# Patient Record
Sex: Male | Born: 1935 | Race: White | Hispanic: No | Marital: Married | Smoking: Former smoker
Health system: Southern US, Community
[De-identification: ages and names within clinical notes are randomized; demographics above are authoritative.]

## PROBLEM LIST (undated history)

## (undated) DIAGNOSIS — M109 Gout, unspecified: Secondary | ICD-10-CM

## (undated) DIAGNOSIS — C4490 Unspecified malignant neoplasm of skin, unspecified: Secondary | ICD-10-CM

## (undated) DIAGNOSIS — I509 Heart failure, unspecified: Secondary | ICD-10-CM

## (undated) DIAGNOSIS — I10 Essential (primary) hypertension: Secondary | ICD-10-CM

## (undated) DIAGNOSIS — I4891 Unspecified atrial fibrillation: Secondary | ICD-10-CM

## (undated) DIAGNOSIS — E860 Dehydration: Secondary | ICD-10-CM

## (undated) DIAGNOSIS — E7800 Pure hypercholesterolemia, unspecified: Secondary | ICD-10-CM

## (undated) HISTORY — DX: Dehydration: E86.0

## (undated) HISTORY — DX: Essential (primary) hypertension: I10

## (undated) HISTORY — DX: Gout, unspecified: M10.9

## (undated) HISTORY — DX: Heart failure, unspecified: I50.9

## (undated) HISTORY — PX: EYE SURGERY: SHX253

---

## 2011-06-29 HISTORY — PX: BASAL CELL CARCINOMA EXCISION: SHX1214

## 2011-11-30 ENCOUNTER — Encounter (HOSPITAL_BASED_OUTPATIENT_CLINIC_OR_DEPARTMENT_OTHER): Payer: Self-pay | Admitting: *Deleted

## 2011-11-30 ENCOUNTER — Ambulatory Visit (INDEPENDENT_AMBULATORY_CARE_PROVIDER_SITE_OTHER): Payer: Medicare Other | Admitting: Internal Medicine

## 2011-11-30 ENCOUNTER — Emergency Department (HOSPITAL_BASED_OUTPATIENT_CLINIC_OR_DEPARTMENT_OTHER)
Admission: EM | Admit: 2011-11-30 | Discharge: 2011-11-30 | Disposition: A | Discharge status: Other Acute Inpatient Hospital | Payer: Medicare Other | Attending: Emergency Medicine | Admitting: Emergency Medicine

## 2011-11-30 ENCOUNTER — Encounter: Payer: Self-pay | Admitting: Internal Medicine

## 2011-11-30 ENCOUNTER — Emergency Department (HOSPITAL_BASED_OUTPATIENT_CLINIC_OR_DEPARTMENT_OTHER): Payer: Medicare Other

## 2011-11-30 VITALS — HR 114 | Ht 66.75 in | Wt 179.0 lb

## 2011-11-30 DIAGNOSIS — E7800 Pure hypercholesterolemia, unspecified: Secondary | ICD-10-CM | POA: Insufficient documentation

## 2011-11-30 DIAGNOSIS — Z7901 Long term (current) use of anticoagulants: Secondary | ICD-10-CM | POA: Insufficient documentation

## 2011-11-30 DIAGNOSIS — I10 Essential (primary) hypertension: Secondary | ICD-10-CM | POA: Insufficient documentation

## 2011-11-30 DIAGNOSIS — I509 Heart failure, unspecified: Secondary | ICD-10-CM | POA: Insufficient documentation

## 2011-11-30 DIAGNOSIS — R0902 Hypoxemia: Secondary | ICD-10-CM

## 2011-11-30 DIAGNOSIS — I4891 Unspecified atrial fibrillation: Secondary | ICD-10-CM | POA: Insufficient documentation

## 2011-11-30 DIAGNOSIS — J9611 Chronic respiratory failure with hypoxia: Secondary | ICD-10-CM | POA: Insufficient documentation

## 2011-11-30 HISTORY — DX: Pure hypercholesterolemia, unspecified: E78.00

## 2011-11-30 HISTORY — DX: Unspecified malignant neoplasm of skin, unspecified: C44.90

## 2011-11-30 HISTORY — DX: Essential (primary) hypertension: I10

## 2011-11-30 HISTORY — DX: Unspecified atrial fibrillation: I48.91

## 2011-11-30 LAB — COMPREHENSIVE METABOLIC PANEL
ALT: 36 U/L (ref 0–53)
Alkaline Phosphatase: 61 U/L (ref 39–117)
BUN: 30 mg/dL — ABNORMAL HIGH (ref 6–23)
CO2: 26 mEq/L (ref 19–32)
Calcium: 9.4 mg/dL (ref 8.4–10.5)
GFR calc Af Amer: 64 mL/min — ABNORMAL LOW (ref >90)
GFR calc non Af Amer: 55 mL/min — ABNORMAL LOW (ref >90)
Glucose, Bld: 112 mg/dL — ABNORMAL HIGH (ref 70–99)
Sodium: 137 mEq/L (ref 135–145)

## 2011-11-30 LAB — DIFFERENTIAL
Basophils Absolute: 0.1 10*3/uL (ref 0.0–0.1)
Basophils Relative: 1 % (ref 0–1)
Neutro Abs: 8.7 10*3/uL — ABNORMAL HIGH (ref 1.7–7.7)
Neutrophils Relative %: 81 % — ABNORMAL HIGH (ref 43–77)

## 2011-11-30 LAB — CBC
Hemoglobin: 16.6 g/dL (ref 13.0–17.0)
MCHC: 33.3 g/dL (ref 30.0–36.0)
RDW: 17.3 % — ABNORMAL HIGH (ref 11.5–15.5)
WBC: 10.8 10*3/uL — ABNORMAL HIGH (ref 4.0–10.5)

## 2011-11-30 MED ORDER — FUROSEMIDE 10 MG/ML IJ SOLN
60.0000 mg | Freq: Once | INTRAMUSCULAR | Status: AC
  Administered 2011-11-30: 60 mg via INTRAVENOUS
  Filled 2011-11-30: qty 6

## 2011-11-30 MED ORDER — NITROGLYCERIN 2 % TD OINT
1.0000 [in_us] | TOPICAL_OINTMENT | Freq: Four times a day (QID) | TRANSDERMAL | Status: DC
  Administered 2011-11-30: 1 [in_us] via TOPICAL
  Filled 2011-11-30: qty 1

## 2011-11-30 NOTE — Progress Notes (Signed)
  Subjective:    Patient ID: Jordan Bautista, male    DOB: August 18, 1928, 78 y.o.   MRN: [id number]  HPI Pt presents to clinic for evaluation of dyspnea and congestion. Notes several week h/o dyspnea without wheezing. Home o2 sat reportedly 69%. No h/o chronic lung disease. Physicians are in [place] and he reports undergoing recent ?PFT with unknown results. Was told he was mildly polycythemic. Unaware of any h/o hypoxia. o2 sat in clinic initially 60's on RA. Increased to upper 80's-90% with 6l [place].   Past Medical History  Diagnosis Date  . Atrial fibrillation   . High cholesterol   . Hypertension   . Skin cancer    No past surgical history on file.  reports that he has quit smoking. He does not have any smokeless tobacco history on file. He reports that he drinks alcohol. He reports that he does not use illicit drugs. family history is not on file. No Known Allergies   Review of Systems  Respiratory: Positive for shortness of breath. Negative for wheezing.   Cardiovascular: Negative for chest pain.  All other systems reviewed and are negative.       Objective:   Physical Exam  Nursing note and vitals reviewed. Constitutional: He appears well-developed and well-nourished. No distress.  HENT:  Head: Normocephalic and atraumatic.  Pulmonary/Chest: Effort normal and breath sounds normal. No respiratory distress. He has no wheezes. He has no rales.  Neurological: He is alert.  Skin: Skin is warm and dry. He is not diaphoretic.  Psychiatric: He has a normal mood and affect.          Assessment & Plan:

## 2011-11-30 NOTE — ED Notes (Signed)
Patient states that over the past week he developed SOB that grew worse, went to primary today and was sent to ER,  No Hx of respiratory problems

## 2011-11-30 NOTE — ED Provider Notes (Signed)
History     CSN: [id number]  Arrival date & time 11/30/11  1138   First MD Initiated Contact with Patient 11/30/11 1211      Chief Complaint  Patient presents with  . Shortness of Breath    HPI Patient presents over the past few days with increasing shortness of breath especially with exertion.  Went to primary care doctor today who found him to be hypoxic on room air and sent her to the emergency department.  Patient has a history of chronic atrial for ablation and takes Coumadin for that.  He also is on dig and amlodipine and metoprolol for blood pressure control.  Patient's never had a heart attack.  He denies any specific chest pain. Past Medical History  Diagnosis Date  . Atrial fibrillation   . High cholesterol   . Hypertension   . Skin cancer     History reviewed. No pertinent past surgical history.  No family history on file.  History  Substance Use Topics  . Smoking status: Former Games developer  . Smokeless tobacco: Not on file  . Alcohol Use: Yes      Review of Systems  Allergies  Review of patient's allergies indicates no known allergies.  Home Medications   Current Outpatient Rx  Name Route Sig Dispense Refill  . AMLODIPINE BESYLATE 5 MG PO TABS Oral Take 5 mg by mouth daily.    Marland Kitchen DIGOXIN 0.25 MG PO TABS Oral Take 250 mcg by mouth daily.    . OMEGA-3 FATTY ACIDS 1000 MG PO CAPS Oral Take 1 g by mouth daily.    Marland Kitchen METOPROLOL SUCCINATE ER 100 MG PO TB24 Oral Take 100 mg by mouth daily. Take with or immediately following a meal.    . NIACIN ER (ANTIHYPERLIPIDEMIC) 1000 MG PO TBCR Oral Take 1,000 mg by mouth at bedtime.    Marland Kitchen PRAVASTATIN SODIUM 20 MG PO TABS Oral Take 20 mg by mouth daily.    . WARFARIN SODIUM 10 MG PO TABS Oral Take 7.5 mg by mouth daily. 10 mg on Sunday      BP 135/81  Pulse 90  Temp(Src) 97.9 F (36.6 C) (Oral)  Resp 20  Ht 5' 7.5" (1.715 m)  Wt 177 lb (80.287 kg)  BMI 27.31 kg/m2  SpO2 90%  Physical Exam  Nursing note and vitals  reviewed. Constitutional: He is oriented to person, place, and time. He appears well-developed and well-nourished. No distress.  HENT:  Head: Normocephalic and atraumatic.  Eyes: Pupils are equal, round, and reactive to light.  Neck: Normal range of motion.  Cardiovascular: Normal rate and intact distal pulses.  An irregularly irregular rhythm present.       HR fibrillation Rate = 92 QTC = 400 QRS = 90 axis = normal Abnormal EKG No previous EKG for comparison  Pulmonary/Chest: He has rales.  Abdominal: Soft. Normal appearance and bowel sounds are normal. He exhibits no distension.  Musculoskeletal: Normal range of motion.  Neurological: He is alert and oriented to person, place, and time. No cranial nerve deficit.  Skin: Skin is warm and dry. No rash noted.  Psychiatric: He has a normal mood and affect. His behavior is normal.    ED Course  Procedures (including critical care time) Scheduled Meds:    . furosemide  60 mg Intravenous Once  . nitroGLYCERIN  1 inch Topical Q6H   Continuous Infusions:  PRN Meds:.  Labs Reviewed  PRO B NATRIURETIC PEPTIDE - Abnormal; Notable for the following:  Pro B Natriuretic peptide (BNP) 6726.0 (*)    All other components within normal limits  PROTIME-INR - Abnormal; Notable for the following:    Prothrombin Time 31.2 (*)    INR 2.95 (*)    All other components within normal limits  COMPREHENSIVE METABOLIC PANEL - Abnormal; Notable for the following:    Glucose, Bld 112 (*)    BUN 30 (*)    Albumin 3.4 (*)    Total Bilirubin 1.5 (*)    GFR calc non Af Amer 55 (*)    GFR calc Af Amer 64 (*)    All other components within normal limits  CBC - Abnormal; Notable for the following:    WBC 10.8 (*)    RDW 17.3 (*)    All other components within normal limits  DIFFERENTIAL - Abnormal; Notable for the following:    Neutrophils Relative 81 (*)    Neutro Abs 8.7 (*)    Lymphocytes Relative 7 (*)    Monocytes Absolute 1.2 (*)    All  other components within normal limits  TROPONIN I   Dg Chest 2 View  11/30/2011  *RADIOLOGY REPORT*  Clinical Data: Shortness of breath for 1 week, history atrial fibrillation, hypertension, hypercholesterolemia, skin cancer  CHEST - 2 VIEW  Comparison: None  Findings: Enlargement of cardiac silhouette. Tortuous aorta with atherosclerotic calcification. Mediastinal contours normal. Patchy bilateral pulmonary infiltrates could represent infection or edema. A more focal area of opacity is seen at the right base, approximately 2.7 x 2.0 cm in size, potentially representing a focal of dense consolidation though underlying nodule is not excluded. No gross pleural effusion or pneumothorax. No acute osseous findings.  IMPRESSION: Enlargement of cardiac silhouette. Patchy bilateral pulmonary infiltrates question edema versus infection. More focal opacity right lung base 2.7 x 2.0 cm in size, question more focal consolidation but underlying mass/nodule not excluded; recommend follow-up radiographs until resolution to exclude underlying pulmonary nodule.  Original Report Authenticated By: Lollie Marrow, M.D.     1. Congestive heart failure       MDM   Patient is requesting admission to [facility].  Plan is to contact the hospitalist for discussion about transfer.      Nelia Shi, MD 11/30/11 [phone]

## 2011-11-30 NOTE — Assessment & Plan Note (Signed)
Given new dx hypoxia recommend transfer to ED for further evaluation. Pt and wife agreeable. Transferred with portable o2 to [facility] ED.

## 2011-12-10 ENCOUNTER — Telehealth: Payer: Self-pay | Admitting: Internal Medicine

## 2011-12-10 NOTE — Telephone Encounter (Signed)
Received medical records from [facility] ° °P: [phone] °F: 878-6100 °

## 2011-12-14 ENCOUNTER — Ambulatory Visit: Payer: BC Managed Care – HMO | Admitting: Internal Medicine

## 2011-12-14 ENCOUNTER — Encounter: Payer: Self-pay | Admitting: Internal Medicine

## 2011-12-14 ENCOUNTER — Ambulatory Visit (INDEPENDENT_AMBULATORY_CARE_PROVIDER_SITE_OTHER): Payer: Medicare Other | Admitting: Internal Medicine

## 2011-12-14 VITALS — BP 108/80 | HR 83 | Temp 97.9°F | Resp 20 | Ht 66.75 in | Wt 162.0 lb

## 2011-12-14 DIAGNOSIS — I509 Heart failure, unspecified: Secondary | ICD-10-CM

## 2011-12-14 DIAGNOSIS — Z7901 Long term (current) use of anticoagulants: Secondary | ICD-10-CM

## 2011-12-14 DIAGNOSIS — I272 Pulmonary hypertension, unspecified: Secondary | ICD-10-CM

## 2011-12-14 DIAGNOSIS — I4891 Unspecified atrial fibrillation: Secondary | ICD-10-CM

## 2011-12-14 DIAGNOSIS — I5032 Chronic diastolic (congestive) heart failure: Secondary | ICD-10-CM | POA: Insufficient documentation

## 2011-12-14 DIAGNOSIS — E785 Hyperlipidemia, unspecified: Secondary | ICD-10-CM

## 2011-12-14 DIAGNOSIS — R634 Abnormal weight loss: Secondary | ICD-10-CM

## 2011-12-14 DIAGNOSIS — I2789 Other specified pulmonary heart diseases: Secondary | ICD-10-CM

## 2011-12-14 NOTE — Assessment & Plan Note (Signed)
Obtain lipid/lft prior to next visit 

## 2011-12-14 NOTE — Patient Instructions (Signed)
Please schedule early morning fasting labs prior to next visit Lipid/lft 272.4, tsh-weight loss, testosterone (decreased muscle mass)

## 2011-12-14 NOTE — Assessment & Plan Note (Signed)
Pulmonary consult for secondary workup

## 2011-12-14 NOTE — Assessment & Plan Note (Signed)
Predates diuresis. Obtain tsh prior to next visit. Early am testosterone due to muscle loss

## 2011-12-14 NOTE — Progress Notes (Signed)
  Subjective:    Patient ID: Jordan Bautista, male    DOB: [birth year], 78 y.o.   MRN: [id number]  HPI Pt presents to clinic for hospital follow up of hypoxia. Last visit transferred to ED due to hypoxia and dyspnea and was subsequently transferred to [facility] for admission. Tx'ed for heart failure with proBNP>6000. Had neg cardiac enzymes and was diuresed with significant improvement of dyspnea. Was able to play golf recently after dc. Echo demonstrated nl lv size and fxn with EF 65-70% however with evidence of right side overload. +pulmonary HTN with 75mg  Hg. H/o chronic atrial fibrillation but no known h/o chronic lung disease. Prior to moving from [place] was beginning pulmonary workup due to mild polycythemia and underwent ?PFT with unknown results. Does snore according to his wife but unsure regarding apnea. Has plans to follow up with Cardiologist from hospitalization who is now following his coumadin.   Past Medical History  Diagnosis Date  . Atrial fibrillation   . High cholesterol   . Hypertension   . Skin cancer    Past Surgical History  Procedure Date  . Basal cell carcinoma excision 2013    reports that he has quit smoking. He has never used smokeless tobacco. He reports that he drinks alcohol. He reports that he does not use illicit drugs. family history includes Breast cancer in his mother and Diabetes in his son.  There is no history of Prostate cancer, and Colon cancer, and Hypertension, and Heart disease, . No Known Allergies   Review of Systems  Constitutional: Positive for unexpected weight change.       Weight loss predating diuresis.  Loss of muscle mass.  Respiratory: Negative for shortness of breath.        Objective:   Physical Exam  Nursing note and vitals reviewed. Constitutional: He appears well-developed and well-nourished. No distress.  HENT:  Head: Normocephalic and atraumatic.  Right Ear: External ear normal.  Left Ear: External ear normal.    Eyes: Conjunctivae are normal. No scleral icterus.  Neck: Neck supple. No JVD present.  Cardiovascular: Normal rate.  An irregularly irregular rhythm present.  Pulmonary/Chest: Effort normal and breath sounds normal. No respiratory distress. He has no wheezes. He has no rales.  Neurological: He is alert.  Skin: Skin is warm. He is not diaphoretic.  Psychiatric: He has a normal mood and affect.          Assessment & Plan:

## 2011-12-14 NOTE — Assessment & Plan Note (Signed)
Clinically euvolemic. Nl EF.

## 2011-12-28 ENCOUNTER — Other Ambulatory Visit: Payer: Self-pay | Admitting: *Deleted

## 2011-12-28 MED ORDER — ISOSORBIDE DINITRATE 30 MG PO TABS: 30.0000 mg | ORAL_TABLET | Freq: Four times a day (QID) | ORAL | Status: DC

## 2011-12-28 MED ORDER — METOPROLOL SUCCINATE ER 100 MG PO TB24: 100.0000 mg | ORAL_TABLET | Freq: Every day | ORAL | Status: DC

## 2011-12-28 MED ORDER — FUROSEMIDE 40 MG PO TABS: 40.0000 mg | ORAL_TABLET | Freq: Every day | ORAL | Status: DC

## 2011-12-28 MED ORDER — POTASSIUM CHLORIDE CRYS ER 20 MEQ PO TBCR: 20.0000 meq | EXTENDED_RELEASE_TABLET | Freq: Every day | ORAL | Status: DC

## 2011-12-28 MED ORDER — WARFARIN SODIUM 5 MG PO TABS: 5.0000 mg | ORAL_TABLET | Freq: Every day | ORAL | Status: DC

## 2011-12-28 MED ORDER — WARFARIN SODIUM 10 MG PO TABS: ORAL_TABLET | ORAL | Status: DC

## 2011-12-28 NOTE — Telephone Encounter (Signed)
Patient request for Metoprolol, Isosorbide, Potassium Chloride, Furosemide, and Warfarin [10 mg on med list, requested 5 mg-both done] Rx[s] Done to pharmacy/[facility]

## 2012-01-18 ENCOUNTER — Ambulatory Visit: Payer: BC Managed Care – HMO | Admitting: Internal Medicine

## 2012-01-25 ENCOUNTER — Telehealth: Payer: Self-pay | Admitting: *Deleted

## 2012-01-25 DIAGNOSIS — M6289 Other specified disorders of muscle: Secondary | ICD-10-CM

## 2012-01-25 DIAGNOSIS — R634 Abnormal weight loss: Secondary | ICD-10-CM

## 2012-01-25 DIAGNOSIS — E785 Hyperlipidemia, unspecified: Secondary | ICD-10-CM

## 2012-01-25 LAB — LIPID PANEL
Cholesterol: 211 mg/dL — ABNORMAL HIGH (ref 0–200)
Triglycerides: 119 mg/dL (ref <150)

## 2012-01-25 LAB — HEPATIC FUNCTION PANEL
ALT: 14 U/L (ref 0–53)
AST: 18 U/L (ref 0–37)
Alkaline Phosphatase: 47 U/L (ref 39–117)
Indirect Bilirubin: 0.6 mg/dL (ref 0.0–0.9)
Total Protein: 6.3 g/dL (ref 6.0–8.3)

## 2012-01-25 NOTE — Telephone Encounter (Signed)
Pt presented to the lab, future lab orders entered per 12/14/11 office note and given to the lab.

## 2012-01-27 ENCOUNTER — Encounter: Payer: Self-pay | Admitting: Critical Care Medicine

## 2012-01-27 ENCOUNTER — Ambulatory Visit (INDEPENDENT_AMBULATORY_CARE_PROVIDER_SITE_OTHER): Payer: Medicare Other | Admitting: Critical Care Medicine

## 2012-01-27 ENCOUNTER — Other Ambulatory Visit: Payer: Self-pay | Admitting: Critical Care Medicine

## 2012-01-27 VITALS — BP 102/70 | HR 57 | Temp 97.3°F | Ht 67.0 in | Wt 160.0 lb

## 2012-01-27 DIAGNOSIS — I272 Pulmonary hypertension, unspecified: Secondary | ICD-10-CM

## 2012-01-27 DIAGNOSIS — I2789 Other specified pulmonary heart diseases: Secondary | ICD-10-CM

## 2012-01-27 LAB — SEDIMENTATION RATE: Sed Rate: 10 mm/hr (ref 0–16)

## 2012-01-27 LAB — HEPATIC FUNCTION PANEL
Albumin: 4.2 g/dL (ref 3.5–5.2)
Alkaline Phosphatase: 49 U/L (ref 39–117)
Total Bilirubin: 0.9 mg/dL (ref 0.3–1.2)
Total Protein: 7 g/dL (ref 6.0–8.3)

## 2012-01-27 LAB — RHEUMATOID FACTOR: Rhuematoid fact SerPl-aCnc: 16 IU/mL — ABNORMAL HIGH (ref <=14)

## 2012-01-27 NOTE — Assessment & Plan Note (Signed)
Severe pulmonary hypertension with underlying diastolic heart failure and associated atrial fibrillation Cannot rule out primary pulmonary hypertension in the setting Need to rule out obstructive sleep apnea and other forms of chronic lung disease Echo 6 /2013: EF 60%  TR  Severe pulm HTN CTA angio Chest 6/13: No PE.  No pulm fibrosis or emphysema by report No recent PFTs or Sleep study pending   Plan Obtain followup on sleep study Obtain CT scan of chest for direct review Repeat pulmonary function studies with 6 minute walk Obtain pulmonary hypertension serology labs Will likely need right heart catheterization for this we'll be discussing with the patient's cardiologist in [facility] Return in one month with all the above studies to regroup No RHC No serology labs yet performed

## 2012-01-27 NOTE — Progress Notes (Signed)
Subjective:    Patient ID: Jordan Bautista, male    DOB: 1928-07-04, 78 y.o.   MRN: [id number]  Shortness of Breath This is a chronic problem. The current episode started more than 1 year ago. The problem occurs daily (exertional). The problem has been gradually worsening. Associated symptoms include rhinorrhea and sputum production. Pertinent negatives include no abdominal pain, chest pain, claudication, coryza, ear pain, fever, headaches, hemoptysis, leg pain, leg swelling, neck pain, orthopnea, PND, rash, sore throat, swollen glands, syncope, vomiting or wheezing. The symptoms are aggravated by any activity. There is no history of allergies, aspirin allergies, bronchiolitis, CAD, chronic lung disease, DVT, a heart failure, PE or a recent surgery.   This patient is referred for evaluation of pulmonary hypertension. The patient was just hospitalized between the fourth and seventh of June for heart failure. The patient was given Lasix. The patient's had his beta blocker increased. Heart rate control was administered because of atrial fibrillation. CT scan she has showed no pulmonary emboli. A sleep study was to be scheduled but has not yet been completed. The patient is now referred for evaluation of pulmonary hypertension.  Patient denies any cough or chest pain. There is mild postnasal drip. There is dyspnea with exertion but not at rest. Patient denies orthopnea or PND. The patient has no history of COPD.  Pt denies any significant sore throat, nasal congestion or excess secretions, fever, chills, sweats, unintended weight loss, pleurtic or exertional chest pain, orthopnea PND, or leg swelling Pt denies any increase in rescue therapy over baseline, denies waking up needing it or having any early am or nocturnal exacerbations of coughing/wheezing/or dyspnea. Pt also denies any obvious fluctuation in symptoms with  weather or environmental change or other alleviating or aggravating factors   Past  Medical History  Diagnosis Date  . Atrial fibrillation   . High cholesterol   . Hypertension   . Skin cancer   . Heart failure      Family History  Problem Relation Age of Onset  . Prostate cancer Neg Hx   . Colon cancer Neg Hx   . Breast cancer Mother   . Hypertension Neg Hx   . Heart disease Neg Hx   . Diabetes Son     2 sons     History   Social History  . Marital Status: Married    Spouse Name: N/A    Number of Children: N/A  . Years of Education: N/A   Occupational History  . retired     Set designer    Social History Main Topics  . Smoking status: Former Smoker -- 1.0 packs/day for 30 years    Types: Cigarettes, Pipe, Cigars    Quit date: 06/28/1978  . Smokeless tobacco: Never Used  . Alcohol Use: Yes  . Drug Use: No  . Sexually Active: Not on file   Other Topics Concern  . Not on file   Social History Narrative  . No narrative on file     No Known Allergies   Outpatient Prescriptions Prior to Visit  Medication Sig Dispense Refill  . amLODipine (NORVASC) 5 MG tablet Take 5 mg by mouth daily.      Marland Kitchen aspirin 81 MG tablet Take 81 mg by mouth daily.      . digoxin (LANOXIN) 0.25 MG tablet Take 250 mcg by mouth daily.      . fish oil-omega-3 fatty acids 1000 MG capsule Take 1 g by mouth daily.      Marland Kitchen  furosemide (LASIX) 40 MG tablet Take 1 tablet (40 mg total) by mouth daily.  30 tablet  5  . metoprolol succinate (TOPROL-XL) 100 MG 24 hr tablet Take 1 tablet (100 mg total) by mouth daily. Take with or immediately following a meal.  30 tablet  5  . niacin (NIASPAN) 1000 MG CR tablet Take 1,000 mg by mouth at bedtime.      . potassium chloride SA (K-DUR,KLOR-CON) 20 MEQ tablet Take 1 tablet (20 mEq total) by mouth daily.  30 tablet  5  . pravastatin (PRAVACHOL) 20 MG tablet Take 20 mg by mouth daily.      Marland Kitchen warfarin (COUMADIN) 10 MG tablet Take As Directed.  60 tablet  5  . warfarin (COUMADIN) 5 MG tablet Take 1 tablet (5 mg total) by mouth daily.  30  tablet  5  . isosorbide dinitrate (ISORDIL) 30 MG tablet Take 1 tablet (30 mg total) by mouth 4 (four) times daily.  120 tablet  5      Review of Systems  Constitutional: Negative for fever, chills, diaphoresis, activity change, appetite change, fatigue and unexpected weight change.  HENT: Positive for congestion, rhinorrhea, sneezing and tinnitus. Negative for hearing loss, ear pain, nosebleeds, sore throat, facial swelling, mouth sores, trouble swallowing, neck pain, neck stiffness, dental problem, voice change, postnasal drip, sinus pressure and ear discharge.   Eyes: Negative for photophobia, discharge, itching and visual disturbance.  Respiratory: Positive for cough, sputum production and shortness of breath. Negative for apnea, hemoptysis, choking, chest tightness, wheezing and stridor.   Cardiovascular: Negative for chest pain, palpitations, orthopnea, claudication, leg swelling, syncope and PND.  Gastrointestinal: Negative for nausea, vomiting, abdominal pain, constipation, blood in stool and abdominal distention.  Genitourinary: Negative for dysuria, urgency, frequency, hematuria, flank pain, decreased urine volume and difficulty urinating.  Musculoskeletal: Negative for myalgias, back pain, joint swelling, arthralgias and gait problem.  Skin: Negative for color change, pallor and rash.  Neurological: Positive for dizziness and light-headedness. Negative for tremors, seizures, syncope, speech difficulty, weakness, numbness and headaches.  Hematological: Negative for adenopathy. Bruises/bleeds easily.  Psychiatric/Behavioral: Negative for confusion, disturbed wake/sleep cycle and agitation. The patient is not nervous/anxious.        Objective:   Physical Exam Filed Vitals:   01/27/12 1424  BP: 102/70  Pulse: 57  Temp: 97.3 F (36.3 C)  TempSrc: Oral  Height: 5\' 7"  (1.702 m)  Weight: 160 lb (72.576 kg)  SpO2: 93%    Gen: Pleasant, well-nourished, in no distress,  normal  affect  ENT: No lesions,  mouth clear,  oropharynx clear, no postnasal drip  Neck: 2+  JVD, no TMG, no carotid bruits  Lungs: No use of accessory muscles, no dullness to percussion, clear without rales or rhonchi  Cardiovascular: RRR, split P2 otherwise  heart sounds normal, no murmur or gallops, no peripheral edema  Abdomen: soft and NT, no HSM,  BS normal  Musculoskeletal: No deformities, no cyanosis or clubbing  Neuro: alert, non focal  Skin: Warm, no lesions or rashes         Assessment & Plan:   Pulmonary hypertension Severe pulmonary hypertension with underlying diastolic heart failure and associated atrial fibrillation Cannot rule out primary pulmonary hypertension in the setting Need to rule out obstructive sleep apnea and other forms of chronic lung disease Echo 6 /2013: EF 60%  TR  Severe pulm HTN CTA angio Chest 6/13: No PE.  No pulm fibrosis or emphysema by report No recent PFTs or  Sleep study pending   Plan Obtain followup on sleep study Obtain CT scan of chest for direct review Repeat pulmonary function studies with 6 minute walk Obtain pulmonary hypertension serology labs Will likely need right heart catheterization for this we'll be discussing with the patient's cardiologist in [facility] Return in one month with all the above studies to regroup No RHC No serology labs yet performed    Updated Medication List Outpatient Encounter Prescriptions as of 01/27/2012  Medication Sig Dispense Refill  . amLODipine (NORVASC) 5 MG tablet Take 5 mg by mouth daily.      Marland Kitchen aspirin 81 MG tablet Take 81 mg by mouth daily.      . digoxin (LANOXIN) 0.25 MG tablet Take 250 mcg by mouth daily.      . fish oil-omega-3 fatty acids 1000 MG capsule Take 1 g by mouth daily.      . furosemide (LASIX) 40 MG tablet Take 1 tablet (40 mg total) by mouth daily.  30 tablet  5  . isosorbide dinitrate (ISORDIL) 30 MG tablet Take 30 mg by mouth daily.      . metoprolol succinate  (TOPROL-XL) 100 MG 24 hr tablet Take 1 tablet (100 mg total) by mouth daily. Take with or immediately following a meal.  30 tablet  5  . niacin (NIASPAN) 1000 MG CR tablet Take 1,000 mg by mouth at bedtime.      . potassium chloride SA (K-DUR,KLOR-CON) 20 MEQ tablet Take 1 tablet (20 mEq total) by mouth daily.  30 tablet  5  . pravastatin (PRAVACHOL) 20 MG tablet Take 20 mg by mouth daily.      Marland Kitchen warfarin (COUMADIN) 10 MG tablet Take As Directed.  60 tablet  5  . warfarin (COUMADIN) 5 MG tablet Take 1 tablet (5 mg total) by mouth daily.  30 tablet  5  . DISCONTD: isosorbide dinitrate (ISORDIL) 30 MG tablet Take 1 tablet (30 mg total) by mouth 4 (four) times daily.  120 tablet  5

## 2012-01-27 NOTE — Patient Instructions (Addendum)
Get sleep study completed, call us when done Obtain pulmonary function studies and 6 min walk I will obtain CT Chest for review Labs today We will likely ask for a right heart catheterization Return when all of above completed, likely in about one month

## 2012-01-28 ENCOUNTER — Telehealth: Payer: Self-pay | Admitting: Critical Care Medicine

## 2012-01-28 LAB — ANA: Anti Nuclear Antibody(ANA): NEGATIVE

## 2012-01-28 NOTE — Progress Notes (Signed)
Quick Note:  lmomtcb ______ 

## 2012-01-28 NOTE — Telephone Encounter (Signed)
Spoke with pt and notified of results per Dr. Wright. Pt verbalized understanding and denied any questions.  

## 2012-01-28 NOTE — Progress Notes (Signed)
Quick Note:  Call pt and tell him labs are ok, No change in medications ______ 

## 2012-01-28 NOTE — Progress Notes (Signed)
Quick Note:  Spoke with pt and notified of results per Dr. Wright. Pt verbalized understanding and denied any questions.  ______ 

## 2012-01-31 ENCOUNTER — Ambulatory Visit (INDEPENDENT_AMBULATORY_CARE_PROVIDER_SITE_OTHER): Payer: Medicare Other | Admitting: Internal Medicine

## 2012-01-31 ENCOUNTER — Ambulatory Visit (INDEPENDENT_AMBULATORY_CARE_PROVIDER_SITE_OTHER): Payer: Medicare Other | Admitting: Critical Care Medicine

## 2012-01-31 ENCOUNTER — Encounter: Payer: Self-pay | Admitting: Internal Medicine

## 2012-01-31 VITALS — BP 104/66 | HR 61 | Temp 97.6°F | Resp 16 | Ht 66.0 in | Wt 161.0 lb

## 2012-01-31 DIAGNOSIS — I2789 Other specified pulmonary heart diseases: Secondary | ICD-10-CM

## 2012-01-31 DIAGNOSIS — I272 Pulmonary hypertension, unspecified: Secondary | ICD-10-CM

## 2012-01-31 DIAGNOSIS — R42 Dizziness and giddiness: Secondary | ICD-10-CM

## 2012-01-31 DIAGNOSIS — E785 Hyperlipidemia, unspecified: Secondary | ICD-10-CM

## 2012-01-31 LAB — PULMONARY FUNCTION TEST

## 2012-01-31 MED ORDER — PRAVASTATIN SODIUM 40 MG PO TABS: 40.0000 mg | ORAL_TABLET | Freq: Every day | ORAL | Status: DC

## 2012-01-31 NOTE — Progress Notes (Signed)
PFT done today. 

## 2012-01-31 NOTE — Assessment & Plan Note (Signed)
Pt relates to possible medication side effect. Trial of decreased norvasc dose 2.5mg  po qd. Monitor bp

## 2012-01-31 NOTE — Progress Notes (Signed)
  Subjective:    Patient ID: Jordan Bautista, male    DOB: [birth year], 78 y.o.   MRN: [id number]  HPI Pt presents to clinic for followup of multiple medical problems. Reviewed elevated chol taking pravachol daily. Undergoing evaluation with pulmonary for pulmonary HTN. Feels some improvement of strength since last visit. C/o am dizziness after taking his medication. No syncope.  Past Medical History  Diagnosis Date  . Atrial fibrillation   . High cholesterol   . Hypertension   . Skin cancer   . Heart failure    Past Surgical History  Procedure Date  . Basal cell carcinoma excision 2013    reports that he quit smoking about 33 years ago. His smoking use included Cigarettes, Pipe, and Cigars. He has a 30 pack-year smoking history. He has never used smokeless tobacco. He reports that he drinks alcohol. He reports that he does not use illicit drugs. family history includes Breast cancer in his mother and Diabetes in his son.  There is no history of Prostate cancer, and Colon cancer, and Hypertension, and Heart disease, . No Known Allergies    Review of Systems see hpi     Objective:   Physical Exam  Nursing note and vitals reviewed. Constitutional: He appears well-developed and well-nourished. No distress.  HENT:  Head: Normocephalic and atraumatic.  Right Ear: External ear normal.  Left Ear: External ear normal.  Eyes: Conjunctivae are normal. No scleral icterus.  Neck: Neck supple. No JVD present.  Cardiovascular: Normal rate, regular rhythm and normal heart sounds.   Pulmonary/Chest: Effort normal and breath sounds normal. No respiratory distress. He has no wheezes. He has no rales.  Neurological: He is alert.  Skin: He is not diaphoretic.  Psychiatric: He has a normal mood and affect.          Assessment & Plan:

## 2012-01-31 NOTE — Patient Instructions (Signed)
Please schedule fasting labs prior to next visit Lipid/lft-272.4, cbc, chem7, digoxin-v58.69

## 2012-01-31 NOTE — Assessment & Plan Note (Signed)
suboptimal control. Increase pravachol 40mg  po qd and recheck lipid/lft prior to next visit

## 2012-02-03 ENCOUNTER — Encounter: Payer: Self-pay | Admitting: Critical Care Medicine

## 2012-02-03 ENCOUNTER — Telehealth: Payer: Self-pay | Admitting: Critical Care Medicine

## 2012-02-03 DIAGNOSIS — I272 Pulmonary hypertension, unspecified: Secondary | ICD-10-CM

## 2012-02-03 NOTE — Telephone Encounter (Signed)
No answer when I call I will ask the pts cardiology MD to perform a right heart catheterization Call the pt and find out if he has had a sleep study yet Tell him PFTs show no primary lung disease We are waiting on Ct Chest from [facility] to arrive (his was the one corrupted) Labs are all normal

## 2012-02-03 NOTE — Telephone Encounter (Signed)
Also find out if the pt has had a sleep study done at all

## 2012-02-03 NOTE — Telephone Encounter (Signed)
I called pt at [phone] - spoke with him for a second but then the call was disconnected.  I called him back and had to lmomtcb.  NOTE:  I was not able to tell pt anything below prior to call being disconnected.

## 2012-02-03 NOTE — Telephone Encounter (Signed)
Pt is aware of PFT and lab results. He said his cardiologist has him scheduled to do a home sleep test to see if an overnight  Test is needed. He was not able to give me the cardiologists name but says he is at [facility] in [facility]. Pt aware PW is still awaiting chest CT and will be speaking to the cardiologist about doing a right heart cath. Pt verbalized understanding and will await further recs. Will forward to [name] and Dr. Delford Field so they are aware.

## 2012-02-03 NOTE — Telephone Encounter (Signed)
Pt returned call

## 2012-02-04 NOTE — Telephone Encounter (Signed)
Note:  I have spoken with Rose at [facility] in Imaging Dept -- was advised she did receive my msg from yesterday regarding needing a new disc with images mailed.  States she has already prepared this and will go out in the mail today.

## 2012-02-04 NOTE — Telephone Encounter (Signed)
Will hold in my box to make sure these are received.

## 2012-02-04 NOTE — Telephone Encounter (Signed)
noted 

## 2012-02-09 NOTE — Telephone Encounter (Signed)
Disc received from [facility] and placed in PW's to do folder.  This disc does work --- images can be pulled up.

## 2012-02-14 ENCOUNTER — Telehealth: Payer: Self-pay | Admitting: Critical Care Medicine

## 2012-02-14 NOTE — Telephone Encounter (Signed)
Pt aware of studies obtained so far Awaiting Sleep study and RHC that are both pending

## 2012-03-02 ENCOUNTER — Ambulatory Visit (INDEPENDENT_AMBULATORY_CARE_PROVIDER_SITE_OTHER): Payer: Medicare Other | Admitting: Critical Care Medicine

## 2012-03-02 ENCOUNTER — Encounter: Payer: Self-pay | Admitting: Critical Care Medicine

## 2012-03-02 VITALS — BP 110/74 | HR 54 | Temp 97.5°F | Ht 67.0 in | Wt 164.0 lb

## 2012-03-02 DIAGNOSIS — I2789 Other specified pulmonary heart diseases: Secondary | ICD-10-CM

## 2012-03-02 DIAGNOSIS — I272 Pulmonary hypertension, unspecified: Secondary | ICD-10-CM

## 2012-03-02 MED ORDER — BOSENTAN 62.5 MG PO TABS: 62.5000 mg | ORAL_TABLET | Freq: Two times a day (BID) | ORAL | Status: DC

## 2012-03-02 NOTE — Progress Notes (Signed)
Subjective:    Patient ID: Jordan Bautista, male    DOB: 03-19-1929, 78 y.o.   MRN: [id number]  Shortness of Breath This is a chronic problem. The current episode started more than 1 year ago. The problem occurs daily (exertional). The problem has been gradually worsening. Associated symptoms include rhinorrhea and sputum production. Pertinent negatives include no abdominal pain, chest pain, claudication, coryza, ear pain, fever, headaches, hemoptysis, leg pain, leg swelling, neck pain, orthopnea, PND, rash, sore throat, swollen glands, syncope, vomiting or wheezing. The symptoms are aggravated by any activity. There is no history of allergies, aspirin allergies, bronchiolitis, CAD, chronic lung disease, DVT, a heart failure, PE or a recent surgery.   This patient is referred for evaluation of pulmonary hypertension. The patient was just hospitalized between the fourth and seventh of June for heart failure. The patient was given Lasix. The patient's had his beta blocker increased. Heart rate control was administered because of atrial fibrillation. CT scan she has showed no pulmonary emboli. A sleep study was to be scheduled but has not yet been completed. The patient is now referred for evaluation of pulmonary hypertension.  Patient denies any cough or chest pain. There is mild postnasal drip. There is dyspnea with exertion but not at rest. Patient denies orthopnea or PND. The patient has no history of COPD.  Pt denies any significant sore throat, nasal congestion or excess secretions, fever, chills, sweats, unintended weight loss, pleurtic or exertional chest pain, orthopnea PND, or leg swelling Pt denies any increase in rescue therapy over baseline, denies waking up needing it or having any early am or nocturnal exacerbations of coughing/wheezing/or dyspnea. Pt also denies any obvious fluctuation in symptoms with  weather or environmental change or other alleviating or aggravating  factors  03/02/2012  Pt had RHC : pulm HTN moderate.  Normal pcwp     Pt had sleep study: In the meantime dyspnea is better.  No real edema on feet.  No cough.  No chest pain.  Pt notes some pndrip. Note  LFTs normal  Past Medical History  Diagnosis Date  . Atrial fibrillation   . High cholesterol   . Hypertension   . Skin cancer   . Heart failure      Family History  Problem Relation Age of Onset  . Prostate cancer Neg Hx   . Colon cancer Neg Hx   . Breast cancer Mother   . Hypertension Neg Hx   . Heart disease Neg Hx   . Diabetes Son     2 sons     History   Social History  . Marital Status: Married    Spouse Name: N/A    Number of Children: N/A  . Years of Education: N/A   Occupational History  . retired     Set designer    Social History Main Topics  . Smoking status: Former Smoker -- 1.0 packs/day for 30 years    Types: Cigarettes, Pipe, Cigars    Quit date: 06/28/1978  . Smokeless tobacco: Never Used  . Alcohol Use: Yes  . Drug Use: No  . Sexually Active: Not on file   Other Topics Concern  . Not on file   Social History Narrative  . No narrative on file     No Known Allergies   Outpatient Prescriptions Prior to Visit  Medication Sig Dispense Refill  . amLODipine (NORVASC) 5 MG tablet Take 5 mg by mouth daily.      Marland Kitchen aspirin 81  MG tablet Take 81 mg by mouth daily.      . digoxin (LANOXIN) 0.25 MG tablet Take 250 mcg by mouth daily.      . fish oil-omega-3 fatty acids 1000 MG capsule Take 500 mg by mouth 2 (two) times daily.       . furosemide (LASIX) 40 MG tablet Take 1 tablet (40 mg total) by mouth daily.  30 tablet  5  . isosorbide dinitrate (ISORDIL) 30 MG tablet Take 30 mg by mouth daily.      . metoprolol succinate (TOPROL-XL) 100 MG 24 hr tablet Take 1 tablet (100 mg total) by mouth daily. Take with or immediately following a meal.  30 tablet  5  . niacin (NIASPAN) 1000 MG CR tablet Take 1,000 mg by mouth at bedtime.      . potassium  chloride SA (K-DUR,KLOR-CON) 20 MEQ tablet Take 1 tablet (20 mEq total) by mouth daily.  30 tablet  5  . pravastatin (PRAVACHOL) 40 MG tablet Take 1 tablet (40 mg total) by mouth daily.  90 tablet  2  . warfarin (COUMADIN) 10 MG tablet Take As Directed.  60 tablet  5  . warfarin (COUMADIN) 5 MG tablet Take 1 tablet (5 mg total) by mouth daily.  30 tablet  5      Review of Systems  Constitutional: Negative for fever, chills, diaphoresis, activity change, appetite change, fatigue and unexpected weight change.  HENT: Positive for congestion, rhinorrhea, sneezing and tinnitus. Negative for hearing loss, ear pain, nosebleeds, sore throat, facial swelling, mouth sores, trouble swallowing, neck pain, neck stiffness, dental problem, voice change, postnasal drip, sinus pressure and ear discharge.   Eyes: Negative for photophobia, discharge, itching and visual disturbance.  Respiratory: Positive for cough, sputum production and shortness of breath. Negative for apnea, hemoptysis, choking, chest tightness, wheezing and stridor.   Cardiovascular: Negative for chest pain, palpitations, orthopnea, claudication, leg swelling, syncope and PND.  Gastrointestinal: Negative for nausea, vomiting, abdominal pain, constipation, blood in stool and abdominal distention.  Genitourinary: Negative for dysuria, urgency, frequency, hematuria, flank pain, decreased urine volume and difficulty urinating.  Musculoskeletal: Negative for myalgias, back pain, joint swelling, arthralgias and gait problem.  Skin: Negative for color change, pallor and rash.  Neurological: Positive for dizziness and light-headedness. Negative for tremors, seizures, syncope, speech difficulty, weakness, numbness and headaches.  Hematological: Negative for adenopathy. Bruises/bleeds easily.  Psychiatric/Behavioral: Negative for confusion, disturbed wake/sleep cycle and agitation. The patient is not nervous/anxious.        Objective:   Physical  Exam  Filed Vitals:   03/02/12 1034  BP: 110/74  Pulse: 54  Temp: 97.5 F (36.4 C)  TempSrc: Oral  Height: 5\' 7"  (1.702 m)  Weight: 164 lb (74.39 kg)  SpO2: 92%    Gen: Pleasant, well-nourished, in no distress,  normal affect  ENT: No lesions,  mouth clear,  oropharynx clear, no postnasal drip  Neck: 2+  JVD, no TMG, no carotid bruits  Lungs: No use of accessory muscles, no dullness to percussion, clear without rales or rhonchi  Cardiovascular: RRR, split P2 otherwise  heart sounds normal, no murmur or gallops, no peripheral edema  Abdomen: soft and NT, no HSM,  BS normal  Musculoskeletal: No deformities, no cyanosis or clubbing  Neuro: alert, non focal  Skin: Warm, no lesions or rashes         Assessment & Plan:   Pulmonary hypertension Echo 6 /2013: EF 60%  TR  Severe pulm HTN CTA  angio Chest 6/13: No PE.  No pulm fibrosis or emphysema by report or per my review Shan Levans)  PFTs : FeV1 95%  TLC 76%  DLCO 59% DL/Va 40% Sleep study pending   RHC August 2013 revealed pulmonary artery pressure 55-60/20 with mean of 30 mm mercury wedge pressure of 14 and normal left ventricular systolic function. No cardiac etiology for pulmonary hypertension seen. All serology neg  Therefore this patient appears to have pulmonary hypertension of unknown primary etiology. A sleep study is the only pending study. In any case this patient would benefit from lowering right heart pressures with a primary in [place] receptor antagonist. Plan Begin Tracleer (Bosantan)  62-1/2 mg twice a day for 4 weeks then increase to 125 mg twice a day as tolerated Baseline liver function profile from August 2013 is normal Return 1 month Followup results of sleep study    Updated Medication List Outpatient Encounter Prescriptions as of 03/02/2012  Medication Sig Dispense Refill  . amLODipine (NORVASC) 5 MG tablet Take 5 mg by mouth daily.      Marland Kitchen aspirin 81 MG tablet Take 81 mg by mouth daily.       . digoxin (LANOXIN) 0.25 MG tablet Take 250 mcg by mouth daily.      . fish oil-omega-3 fatty acids 1000 MG capsule Take 500 mg by mouth 2 (two) times daily.       . furosemide (LASIX) 40 MG tablet Take 1 tablet (40 mg total) by mouth daily.  30 tablet  5  . isosorbide dinitrate (ISORDIL) 30 MG tablet Take 30 mg by mouth daily.      . metoprolol succinate (TOPROL-XL) 100 MG 24 hr tablet Take 1 tablet (100 mg total) by mouth daily. Take with or immediately following a meal.  30 tablet  5  . niacin (NIASPAN) 1000 MG CR tablet Take 1,000 mg by mouth at bedtime.      . potassium chloride SA (K-DUR,KLOR-CON) 20 MEQ tablet Take 1 tablet (20 mEq total) by mouth daily.  30 tablet  5  . pravastatin (PRAVACHOL) 40 MG tablet Take 1 tablet (40 mg total) by mouth daily.  90 tablet  2  . warfarin (COUMADIN) 10 MG tablet Take As Directed.  60 tablet  5  . warfarin (COUMADIN) 5 MG tablet Take by mouth as directed.      Marland Kitchen DISCONTD: warfarin (COUMADIN) 5 MG tablet Take 1 tablet (5 mg total) by mouth daily.  30 tablet  5  . bosentan (TRACLEER) 62.5 MG tablet Take 1 tablet (62.5 mg total) by mouth 2 (two) times daily. For four  weeks then 125mg  twice daily  60 tablet  11

## 2012-03-02 NOTE — Patient Instructions (Addendum)
Stay on oxygen We will try to obtain Tracleer :  62.5mg  twice daily for 4 weeks then 125mg  twice daily and stay.  You will sign a release form for same We will follow up with you on sleep study results Return 1 month

## 2012-03-02 NOTE — Assessment & Plan Note (Signed)
Echo 6 /2013: EF 60%  TR  Severe pulm HTN CTA angio Chest 6/13: No PE.  No pulm fibrosis or emphysema by report or per my review Shan Levans)  PFTs : FeV1 95%  TLC 76%  DLCO 59% DL/Va 16% Sleep study pending   RHC August 2013 revealed pulmonary artery pressure 55-60/20 with mean of 30 mm mercury wedge pressure of 14 and normal left ventricular systolic function. No cardiac etiology for pulmonary hypertension seen. All serology neg  Therefore this patient appears to have pulmonary hypertension of unknown primary etiology. A sleep study is the only pending study. In any case this patient would benefit from lowering right heart pressures with a primary in [place] receptor antagonist. Plan Begin Tracleer ([place]os[place]an)  62-1/2 mg twice a day for 4 weeks then increase to 125 mg twice a day as tolerated Baseline liver function profile from August 2013 is normal Return 1 month Followup results of sleep study

## 2012-03-09 ENCOUNTER — Telehealth: Payer: Self-pay | Admitting: Critical Care Medicine

## 2012-03-09 NOTE — Telephone Encounter (Signed)
Per Lawson Fiscal since she is doing PA/refill she will work on this

## 2012-03-09 NOTE — Telephone Encounter (Signed)
Pt returned Lori's call & stated the number he has on the back of his insurance card is [phone].  [name]

## 2012-03-09 NOTE — Telephone Encounter (Signed)
ID# [id number]  I spoke with [facility] and a PA is needed for the Tracleer. I have tried calling the customer service number for [employer], pt's ins., and was placed on hold for approx. 10 minutes. I will try to call again later today.

## 2012-03-09 NOTE — Telephone Encounter (Signed)
ATC pt's insurance again for Pa. This time I was placed on eternal hold waiting for a customer service rep to initiate this for the pt. I will see if there are any other numbers to contact pt's insurance.  LMOM  x 1 for the pt. We need to verify insurance information with the pt.

## 2012-03-10 NOTE — Telephone Encounter (Signed)
I was able to contact the pt's insurance at the number provided by the pt and initiated the PA for Tracleer. [facility] will be faxing over the Pa form for Pw to fill out , sign and fax back to them for review. Will await fax.

## 2012-03-13 NOTE — Telephone Encounter (Signed)
I spoke with representative at [facility] and they said to fax the Echo to [phone]. They can then reevaluate coverage of the Tracleer. Awaiting response. Will forward to [name] so she can follow-up on this.

## 2012-03-13 NOTE — Telephone Encounter (Signed)
The only form that has been received is a denial due to  No documentation of an echo or wedge pressure  This is needed in otder to have request for Tracleer reconsideration. I have printed both the Echo and cardiac cath reports from 2013. Dr. Delford Field, pls advise on faxing this to the pt's insurance for reconsideration of Tracleer therapy.

## 2012-03-13 NOTE — Telephone Encounter (Signed)
Yes ,  This should have been done the first time  Thank you

## 2012-03-15 NOTE — Telephone Encounter (Signed)
Received APPROVAL letter for tracleer.  There are no approval dates on this letter.  Have given to front desk to scan into pt's chart.    Called [facility] at # provided above, spoke with Alferd Apa.  Informed her we have received an approval letter for tracleer.  She verbalized understanding of this and voiced no further questions/concerns at this time.

## 2012-03-28 ENCOUNTER — Telehealth: Payer: Self-pay | Admitting: *Deleted

## 2012-03-28 NOTE — Telephone Encounter (Signed)
Received a denial letter for Tracleer AFTER an approval letter was received.  I called [employer] at [phone].  Was advised the Tracleer DENIAL letter can be disregarded.  This was mailed before the information was received for an approval.  Tracleer has been APPROVED according to approval letter scanned in pt's chart.  I called, spoke with pt to make sure he was aware that Tracleer has been APPROVED by insurance.  He is already aware of this but states it has a "significant copay."  He has not yet started on this medication.  He does have a pending OV with Dr. Delford Field on Thursday, Oct 3 in [facility] and plans to discuss this with Dr. Delford Field then.  Will sign off and route to PW as FYI.

## 2012-03-28 NOTE — Telephone Encounter (Signed)
Ok , keep appt with me

## 2012-03-30 ENCOUNTER — Ambulatory Visit (INDEPENDENT_AMBULATORY_CARE_PROVIDER_SITE_OTHER): Payer: Medicare Other | Admitting: Critical Care Medicine

## 2012-03-30 ENCOUNTER — Telehealth: Payer: Self-pay | Admitting: Internal Medicine

## 2012-03-30 ENCOUNTER — Encounter: Payer: Self-pay | Admitting: Critical Care Medicine

## 2012-03-30 VITALS — BP 108/56 | HR 61 | Temp 97.7°F | Ht 67.0 in | Wt 165.0 lb

## 2012-03-30 DIAGNOSIS — I272 Pulmonary hypertension, unspecified: Secondary | ICD-10-CM

## 2012-03-30 DIAGNOSIS — I2789 Other specified pulmonary heart diseases: Secondary | ICD-10-CM

## 2012-03-30 DIAGNOSIS — G4733 Obstructive sleep apnea (adult) (pediatric): Secondary | ICD-10-CM | POA: Insufficient documentation

## 2012-03-30 MED ORDER — AMLODIPINE BESYLATE 5 MG PO TABS: 5.0000 mg | ORAL_TABLET | Freq: Every day | ORAL | Status: DC

## 2012-03-30 NOTE — Patient Instructions (Signed)
Obtain cpap  Obtain split night sleep study Will hold off on tracleer for now Return 6 weeks

## 2012-03-30 NOTE — Assessment & Plan Note (Signed)
Moderate to severe sleep apnea on recent home sleep study with AHI of over 25 This is likely contributing to pulmonary hypertension Plan Hold off on endothelin receptor antagonist therapy for now We'll obtain CPAP with CPAP titration Followup patient following results of the CPAP titration and CPAP therapy

## 2012-03-30 NOTE — Assessment & Plan Note (Signed)
Moderate pulmonary hypertension which now appears to be on the basis of obstructive sleep apneaEcho 6 /2013: EF 60%  TR  Severe pulm HTN CTA angio Chest 6/13: No PE.  No pulm fibrosis or emphysema by report or per my review Jordan Bautista)  PFTs : FeV1 95%  TLC 76%  DLCO 59% DL/Va 40% Sleep study moderate sleep apnea with AHI of 27  RHC August 2013 revealed pulmonary artery pressure 55-60/20 with mean of 30 mm mercury wedge pressure of 14 and normal left ventricular systolic function. No cardiac etiology for pulmonary hypertension seen. All serology neg  Plan Will treat sleep apnea first before embarking on oral pulmonary hypertensive medications

## 2012-03-30 NOTE — Progress Notes (Signed)
Subjective:    Patient ID: Jordan Bautista, male    DOB: [birth year], 78 y.o.   MRN: [id number]  Shortness of Breath This is a chronic problem. The current episode started more than 1 year ago. The problem occurs daily (exertional). The problem has been gradually worsening. Associated symptoms include rhinorrhea and sputum production. Pertinent negatives include no abdominal pain, chest pain, claudication, coryza, ear pain, fever, headaches, hemoptysis, leg pain, leg swelling, neck pain, orthopnea, PND, rash, sore throat, swollen glands, syncope, vomiting or wheezing. The symptoms are aggravated by any activity. There is no history of allergies, aspirin allergies, bronchiolitis, CAD, chronic lung disease, DVT, a heart failure, PE or a recent surgery.   This patient is referred for evaluation of pulmonary hypertension. The patient was just hospitalized between the fourth and seventh of June for heart failure. The patient was given Lasix. The patient's had his beta blocker increased. Heart rate control was administered because of atrial fibrillation. CT scan she has showed no pulmonary emboli. A sleep study was to be scheduled but has not yet been completed. The patient is now referred for evaluation of pulmonary hypertension.  Patient denies any cough or chest pain. There is mild postnasal drip. There is dyspnea with exertion but not at rest. Patient denies orthopnea or PND. The patient has no history of COPD.  Pt denies any significant sore throat, nasal congestion or excess secretions, fever, chills, sweats, unintended weight loss, pleurtic or exertional chest pain, orthopnea PND, or leg swelling Pt denies any increase in rescue therapy over baseline, denies waking up needing it or having any early am or nocturnal exacerbations of coughing/wheezing/or dyspnea. Pt also denies any obvious fluctuation in symptoms with  weather or environmental change or other alleviating or aggravating  factors  03/02/2012  Pt had RHC : pulm HTN moderate.  Normal pcwp     Pt had sleep study: In the meantime dyspnea is better.  No real edema on feet.  No cough.  No chest pain.  Pt notes some pndrip. Note  LFTs normal  03/30/2012 Sleep study abnormal.  Elevated AHI.  Tracleer is too expensive.  Pt noted since in hospital 6/13,  Daytime sleepiness is better with nocturnal oxygen.  Pt is still fatigued.   Pt says pt makes a lot of noise when sleeping.      Past Medical History  Diagnosis Date  . Atrial fibrillation   . High cholesterol   . Hypertension   . Skin cancer   . Heart failure      Family History  Problem Relation Age of Onset  . Prostate cancer Neg Hx   . Colon cancer Neg Hx   . Breast cancer Mother   . Hypertension Neg Hx   . Heart disease Neg Hx   . Diabetes Son     2 sons     History   Social History  . Marital Status: Married    Spouse Name: N/A    Number of Children: N/A  . Years of Education: N/A   Occupational History  . retired     Set designer    Social History Main Topics  . Smoking status: Former Smoker -- 1.0 packs/day for 30 years    Types: Cigarettes, Pipe, Cigars    Quit date: 06/28/1978  . Smokeless tobacco: Never Used  . Alcohol Use: Yes  . Drug Use: No  . Sexually Active: Not on file   Other Topics Concern  . Not on file  Social History Narrative  . No narrative on file     No Known Allergies   Outpatient Prescriptions Prior to Visit  Medication Sig Dispense Refill  . aspirin 81 MG tablet Take 81 mg by mouth daily.      . digoxin (LANOXIN) 0.25 MG tablet Take 250 mcg by mouth daily.      . fish oil-omega-3 fatty acids 1000 MG capsule Take 500 mg by mouth 2 (two) times daily.       . furosemide (LASIX) 40 MG tablet Take 1 tablet (40 mg total) by mouth daily.  30 tablet  5  . isosorbide dinitrate (ISORDIL) 30 MG tablet Take 30 mg by mouth daily.      . metoprolol succinate (TOPROL-XL) 100 MG 24 hr tablet Take 1 tablet (100  mg total) by mouth daily. Take with or immediately following a meal.  30 tablet  5  . niacin (NIASPAN) 1000 MG CR tablet Take 1,000 mg by mouth at bedtime.      . potassium chloride SA (K-DUR,KLOR-CON) 20 MEQ tablet Take 1 tablet (20 mEq total) by mouth daily.  30 tablet  5  . pravastatin (PRAVACHOL) 40 MG tablet Take 1 tablet (40 mg total) by mouth daily.  90 tablet  2  . warfarin (COUMADIN) 10 MG tablet Take As Directed.  60 tablet  5  . warfarin (COUMADIN) 5 MG tablet Take by mouth as directed.      Marland Kitchen amLODipine (NORVASC) 5 MG tablet Take 5 mg by mouth daily.      Marland Kitchen bosentan (TRACLEER) 62.5 MG tablet Take 1 tablet (62.5 mg total) by mouth 2 (two) times daily. For four  weeks then 125mg  twice daily  60 tablet  11      Review of Systems  Constitutional: Negative for fever, chills, diaphoresis, activity change, appetite change, fatigue and unexpected weight change.  HENT: Positive for congestion, rhinorrhea, sneezing and tinnitus. Negative for hearing loss, ear pain, nosebleeds, sore throat, facial swelling, mouth sores, trouble swallowing, neck pain, neck stiffness, dental problem, voice change, postnasal drip, sinus pressure and ear discharge.   Eyes: Negative for photophobia, discharge, itching and visual disturbance.  Respiratory: Positive for cough, sputum production and shortness of breath. Negative for apnea, hemoptysis, choking, chest tightness, wheezing and stridor.   Cardiovascular: Negative for chest pain, palpitations, orthopnea, claudication, leg swelling, syncope and PND.  Gastrointestinal: Negative for nausea, vomiting, abdominal pain, constipation, blood in stool and abdominal distention.  Genitourinary: Negative for dysuria, urgency, frequency, hematuria, flank pain, decreased urine volume and difficulty urinating.  Musculoskeletal: Negative for myalgias, back pain, joint swelling, arthralgias and gait problem.  Skin: Negative for color change, pallor and rash.  Neurological:  Positive for dizziness and light-headedness. Negative for tremors, seizures, syncope, speech difficulty, weakness, numbness and headaches.  Hematological: Negative for adenopathy. Bruises/bleeds easily.  Psychiatric/Behavioral: Negative for confusion, disturbed wake/sleep cycle and agitation. The patient is not nervous/anxious.        Objective:   Physical Exam  Filed Vitals:   03/30/12 1025  BP: 108/56  Pulse: 61  Temp: 97.7 F (36.5 C)  TempSrc: Oral  Height: 5\' 7"  (1.702 m)  Weight: 165 lb (74.844 kg)  SpO2: 96%    Gen: Pleasant, well-nourished, in no distress,  normal affect  ENT: No lesions,  mouth clear,  oropharynx clear, no postnasal drip  Neck: 2+  JVD, no TMG, no carotid bruits  Lungs: No use of accessory muscles, no dullness to percussion, clear without rales  or rhonchi  Cardiovascular: RRR, split P2 otherwise  heart sounds normal, no murmur or gallops, no peripheral edema  Abdomen: soft and NT, no HSM,  BS normal  Musculoskeletal: No deformities, no cyanosis or clubbing  Neuro: alert, non focal  Skin: Warm, no lesions or rashes         Assessment & Plan:   OSA (obstructive sleep apnea) Moderate to severe sleep apnea on recent home sleep study with AHI of over 25 This is likely contributing to pulmonary hypertension Plan Hold off on endothelin receptor antagonist therapy for now We'll obtain CPAP with CPAP titration Followup patient following results of the CPAP titration and CPAP therapy  Pulmonary hypertension Moderate pulmonary hypertension which now appears to be on the basis of obstructive sleep apneaEcho 6 /2013: EF 60%  TR  Severe pulm HTN CTA angio Chest 6/13: No PE.  No pulm fibrosis or emphysema by report or per my review Shan Levans)  PFTs : FeV1 95%  TLC 76%  DLCO 59% DL/Va 57% Sleep study moderate sleep apnea with AHI of 27  RHC August 2013 revealed pulmonary artery pressure 55-60/20 with mean of 30 mm mercury wedge pressure of 14  and normal left ventricular systolic function. No cardiac etiology for pulmonary hypertension seen. All serology neg  Plan Will treat sleep apnea first before embarking on oral pulmonary hypertensive medications     Updated Medication List Outpatient Encounter Prescriptions as of 03/30/2012  Medication Sig Dispense Refill  . aspirin 81 MG tablet Take 81 mg by mouth daily.      . digoxin (LANOXIN) 0.25 MG tablet Take 250 mcg by mouth daily.      . fish oil-omega-3 fatty acids 1000 MG capsule Take 500 mg by mouth 2 (two) times daily.       . furosemide (LASIX) 40 MG tablet Take 1 tablet (40 mg total) by mouth daily.  30 tablet  5  . isosorbide dinitrate (ISORDIL) 30 MG tablet Take 30 mg by mouth daily.      . metoprolol succinate (TOPROL-XL) 100 MG 24 hr tablet Take 1 tablet (100 mg total) by mouth daily. Take with or immediately following a meal.  30 tablet  5  . niacin (NIASPAN) 1000 MG CR tablet Take 1,000 mg by mouth at bedtime.      . potassium chloride SA (K-DUR,KLOR-CON) 20 MEQ tablet Take 1 tablet (20 mEq total) by mouth daily.  30 tablet  5  . pravastatin (PRAVACHOL) 40 MG tablet Take 1 tablet (40 mg total) by mouth daily.  90 tablet  2  . warfarin (COUMADIN) 10 MG tablet Take As Directed.  60 tablet  5  . warfarin (COUMADIN) 5 MG tablet Take by mouth as directed.      Marland Kitchen DISCONTD: amLODipine (NORVASC) 5 MG tablet Take 5 mg by mouth daily.      Marland Kitchen DISCONTD: bosentan (TRACLEER) 62.5 MG tablet Take 1 tablet (62.5 mg total) by mouth 2 (two) times daily. For four  weeks then 125mg  twice daily  60 tablet  11

## 2012-03-30 NOTE — Telephone Encounter (Signed)
Done/SLS 

## 2012-04-04 ENCOUNTER — Telehealth: Payer: Self-pay | Admitting: Critical Care Medicine

## 2012-04-04 NOTE — Telephone Encounter (Signed)
Jordan Bautista changed her mind & stated that she will call back after 04/23/12 split night sleep study.    Jordan Bautista

## 2012-04-23 ENCOUNTER — Ambulatory Visit (HOSPITAL_BASED_OUTPATIENT_CLINIC_OR_DEPARTMENT_OTHER): Payer: Medicare Other | Attending: Critical Care Medicine | Admitting: Radiology

## 2012-04-23 VITALS — Ht 67.0 in | Wt 160.0 lb

## 2012-04-23 DIAGNOSIS — I2789 Other specified pulmonary heart diseases: Secondary | ICD-10-CM | POA: Insufficient documentation

## 2012-04-23 DIAGNOSIS — R0989 Other specified symptoms and signs involving the circulatory and respiratory systems: Secondary | ICD-10-CM | POA: Insufficient documentation

## 2012-04-23 DIAGNOSIS — I509 Heart failure, unspecified: Secondary | ICD-10-CM | POA: Insufficient documentation

## 2012-04-23 DIAGNOSIS — R0609 Other forms of dyspnea: Secondary | ICD-10-CM | POA: Insufficient documentation

## 2012-04-23 DIAGNOSIS — G4733 Obstructive sleep apnea (adult) (pediatric): Secondary | ICD-10-CM | POA: Insufficient documentation

## 2012-04-23 DIAGNOSIS — I10 Essential (primary) hypertension: Secondary | ICD-10-CM | POA: Insufficient documentation

## 2012-04-23 DIAGNOSIS — I2510 Atherosclerotic heart disease of native coronary artery without angina pectoris: Secondary | ICD-10-CM | POA: Insufficient documentation

## 2012-04-27 ENCOUNTER — Telehealth: Payer: Self-pay | Admitting: *Deleted

## 2012-04-27 DIAGNOSIS — E785 Hyperlipidemia, unspecified: Secondary | ICD-10-CM

## 2012-04-27 DIAGNOSIS — Z79899 Other long term (current) drug therapy: Secondary | ICD-10-CM

## 2012-04-27 LAB — CBC WITH DIFFERENTIAL/PLATELET
Eosinophils Absolute: 0.5 10*3/uL (ref 0.0–0.7)
Hemoglobin: 15.2 g/dL (ref 13.0–17.0)
Lymphocytes Relative: 17 % (ref 12–46)
Lymphs Abs: 1.2 10*3/uL (ref 0.7–4.0)
MCH: 30.3 pg (ref 26.0–34.0)
Monocytes Relative: 10 % (ref 3–12)
Neutro Abs: 4.8 10*3/uL (ref 1.7–7.7)
Neutrophils Relative %: 66 % (ref 43–77)
Platelets: 172 10*3/uL (ref 150–400)
RBC: 5.01 MIL/uL (ref 4.22–5.81)
WBC: 7.3 10*3/uL (ref 4.0–10.5)

## 2012-04-27 LAB — LIPID PANEL
Cholesterol: 197 mg/dL (ref 0–200)
LDL Cholesterol: 137 mg/dL — ABNORMAL HIGH (ref 0–99)
Total CHOL/HDL Ratio: 5.3 Ratio
VLDL: 23 mg/dL (ref 0–40)

## 2012-04-27 LAB — HEPATIC FUNCTION PANEL
ALT: 14 U/L (ref 0–53)
AST: 22 U/L (ref 0–37)
Alkaline Phosphatase: 44 U/L (ref 39–117)
Bilirubin, Direct: 0.1 mg/dL (ref 0.0–0.3)
Total Bilirubin: 0.7 mg/dL (ref 0.3–1.2)

## 2012-04-27 LAB — BASIC METABOLIC PANEL
BUN: 31 mg/dL — ABNORMAL HIGH (ref 6–23)
Chloride: 101 mEq/L (ref 96–112)
Potassium: 4.3 mEq/L (ref 3.5–5.3)
Sodium: 137 mEq/L (ref 135–145)

## 2012-04-27 NOTE — Telephone Encounter (Signed)
Pt presented to the lab, orders entered per last office visit below:  Please schedule fasting labs prior to next visit  Lipid/lft-272.4, cbc, chem7, digoxin-v58.69

## 2012-04-28 LAB — DIGOXIN LEVEL: Digoxin Level: 1.6 ng/mL (ref 0.8–2.0)

## 2012-05-02 ENCOUNTER — Telehealth: Payer: Self-pay | Admitting: Critical Care Medicine

## 2012-05-02 ENCOUNTER — Ambulatory Visit: Payer: BC Managed Care – HMO | Admitting: Internal Medicine

## 2012-05-02 NOTE — Telephone Encounter (Signed)
lmomtcb x1 for Jordan Bautista

## 2012-05-02 NOTE — Telephone Encounter (Signed)
Loletha Grayer with [employer] [phone]) called and left message for an update on the patients intentions of taking the Tracleer. Requiring notification on whether the patient is going to begin this medication.   According to last OV, 10/3 and message previous to that 10/1, patient was approved but is holding off on the medication per PW until f/u. Patient to follow up 11/14 with PW--will patient be starting the medication then?   Dr Delford Field please advise so patient will not lose his approval status. Thanks

## 2012-05-02 NOTE — Telephone Encounter (Signed)
He has declined to use this med at this time

## 2012-05-03 NOTE — Telephone Encounter (Signed)
Spoke with Shey-aware that patient has declined to use medication at this time. Karl Luke would like to let PW know that there is a new medication (opsumit) and doesn't require LFT checks. Will sign and forward to PW.

## 2012-05-03 NOTE — Telephone Encounter (Signed)
Noted.  Pushy, but noted

## 2012-05-03 NOTE — Telephone Encounter (Signed)
LMTCB

## 2012-05-08 DIAGNOSIS — G4733 Obstructive sleep apnea (adult) (pediatric): Secondary | ICD-10-CM

## 2012-05-09 NOTE — Procedures (Signed)
NAMEROBSON, HENSCHEN                ACCOUNT NO.:  [id number]  MEDICAL RECORD NO.:  [id number]          PATIENT TYPE:  OUT  LOCATION:  SLEEP CENTER                 FACILITY:  [facility]  PHYSICIAN:  Jordan Milch, MD      DATE OF BIRTH:  March 18, 1929  DATE OF STUDY:  04/23/2012                           NOCTURNAL POLYSOMNOGRAM  REFERRING PHYSICIAN:  Charlcie Cradle. Delford Field, MD, FCCP  INDICATION FOR STUDY:  Jordan Bautista is an 78 year old gentleman with hypertension, coronary artery disease, congestive heart failure, and pulmonary hypertension.  He presents with loud snoring, excessive daytime fatigue, talking in sleep.  He is maintained on nocturnal oxygen at 3 L/minute.  At the time of this study, he weighed 160 pounds with a height of 5 feet 7 inches, BMI of 27, neck size of 14.5 inches.  EPWORTH SLEEPINESS SCORE:  5.  MEDICATIONS:  Bedtime Medications:  None.  This nocturnal polysomnogram was performed with a sleep technologist in attendance.  EEG, EOG, EMG, EKG, and respiratory parameters were recorded.  Sleep stages, arousals, limb movements, and respiratory data were scored according to criteria laid out by the American Academy of Sleep Medicine.  SLEEP ARCHITECTURE:  Lights out was at 9:26 p.m. Lights on was at 5:17 a.m.  Total sleep time was 336 minutes with a sleep period time of 388 minutes and a sleep efficiency of 71%.  Sleep latency was 82 minutes and latency to REM sleep was 190 minutes.  Awake after sleep onset was 53 minutes.  Sleep stages of the percentage of total sleep time was N1 5%, N2 87% N3 0%, REM sleep 7% (25 minutes).  Supine sleep accounted 47 minutes.  Arousal Data:  There were 99 arousals with an arousal index of 18 events per hour.  Of these 17 were spontaneous and the rest were associated with respiratory events.  RESPIRATORY DATA:  There were 11 obstructive apneas, 0 central apnea, 0 mixed apneas, and 63 hypopneas with an apnea-hypopnea index of 13 events per  hour, 16 RERA's were noted with an RDI of 16 events per hour.  The longest apnea was 20 seconds and the longest hypopnea was 54 seconds.  OXYGEN DATA:  The desaturation index was 19 events per hour.  The lowest desaturation was 80% during non-REM sleep.  He spent 35 minutes with saturation less than 88%.  CARDIAC DATA:  The low heart rate was 35 beats per minute.  The high heart rate recorded was an artifact.  No arrhythmias were noted.  MOVEMENT-PARASOMNIA:  No significant limb movements were noted.   Discussion:  He was desensitized with a medium Quattro full-face mask. Oxygen was initiated at 1 L/minute prior to the study for low sats.   IMPRESSIONS-RECOMMENDATIONS: 1. Mild-to-moderate obstructive sleep apnea with hypopneas causing     sleep fragmentation and oxygen desaturation.  He did not meet     criteria for split intervention. 2. Nocturnal hypoxemia corrected by 1-2 L of oxygen. 3. No evidence of cardiac arrhythmias, limb movements, or behavioral     disturbance during sleep.  Recommendations: 1. The treatment options for this degree of sleep-disordered breathing     include CPAP therapy or oral  appliance. 2. Auto CPAP can be used with a medium full-face mask or alternatively     given his comorbidities CPAP titration can be performed in the lab. 3. He should be cautioned against driving when sleepy.  He should be     asked to avoid medications with sedative side effects. 4. Note that obstructive sleep apnea is causative for only mild     degree of pulmonary hypertension. 5.Nocturnal hypoxemia corrected by 1-2 L of oxygen       Jordan Milch, MD    RVA/MEDQ  D:  05/08/2012 14:01:51  T:  05/09/2012 02:09:50  Job:  [id number]  cc:   Charlcie Cradle. Delford Field, MD, FCCP [address] [place] [address]

## 2012-05-11 ENCOUNTER — Ambulatory Visit (INDEPENDENT_AMBULATORY_CARE_PROVIDER_SITE_OTHER): Payer: Medicare Other | Admitting: Internal Medicine

## 2012-05-11 ENCOUNTER — Encounter: Payer: Self-pay | Admitting: Internal Medicine

## 2012-05-11 ENCOUNTER — Ambulatory Visit (INDEPENDENT_AMBULATORY_CARE_PROVIDER_SITE_OTHER): Payer: Medicare Other | Admitting: Critical Care Medicine

## 2012-05-11 ENCOUNTER — Telehealth: Payer: Self-pay | Admitting: Internal Medicine

## 2012-05-11 ENCOUNTER — Encounter: Payer: Self-pay | Admitting: Critical Care Medicine

## 2012-05-11 VITALS — BP 122/68 | HR 55 | Temp 97.3°F | Resp 18 | Wt 170.5 lb

## 2012-05-11 VITALS — BP 96/60 | HR 58 | Temp 97.4°F | Ht 67.0 in | Wt 169.0 lb

## 2012-05-11 DIAGNOSIS — G4733 Obstructive sleep apnea (adult) (pediatric): Secondary | ICD-10-CM

## 2012-05-11 DIAGNOSIS — I272 Pulmonary hypertension, unspecified: Secondary | ICD-10-CM

## 2012-05-11 DIAGNOSIS — I2789 Other specified pulmonary heart diseases: Secondary | ICD-10-CM

## 2012-05-11 DIAGNOSIS — Z23 Encounter for immunization: Secondary | ICD-10-CM

## 2012-05-11 DIAGNOSIS — E785 Hyperlipidemia, unspecified: Secondary | ICD-10-CM

## 2012-05-11 NOTE — Patient Instructions (Addendum)
A cpap will be set up today No other medication changes Return 6 weeks

## 2012-05-11 NOTE — Telephone Encounter (Signed)
Please schedule fasting labs prior to next visit  Lipid-272.4  Patient has upcoming appointment in February/2014. He will be going to [facility] lab

## 2012-05-11 NOTE — Patient Instructions (Signed)
Please schedule fasting labs prior to next visit Lipid-272.4 

## 2012-05-11 NOTE — Assessment & Plan Note (Signed)
Secondary pulmonary hypertension likely on the basis of severe obstructive sleep apnea. Unfortunately the patient has not yet received CPAP therapy Patient will be receiving CPAP therapy in the next 24 hours. Continue oxygen therapy with CPAP at night Return 1 month

## 2012-05-11 NOTE — Assessment & Plan Note (Signed)
Severe obstructive sleep apnea with secondary pulmonary hypertension Plan Obtain for the patient CPAP therapy

## 2012-05-11 NOTE — Progress Notes (Signed)
Subjective:    Patient ID: Jordan Bautista, male    DOB: February 17, 1929, 78 y.o.   MRN: [id number]  HPI This patient is referred for evaluation of pulmonary hypertension. The patient was just hospitalized between the fourth and seventh of June for heart failure. The patient was given Lasix. The patient's had his beta blocker increased. Heart rate control was administered because of atrial fibrillation. CT scan she has showed no pulmonary emboli. A sleep study was to be scheduled but has not yet been completed. The patient is now referred for evaluation of pulmonary hypertension.  Patient denies any cough or chest pain. There is mild postnasal drip. There is dyspnea with exertion but not at rest. Patient denies orthopnea or PND. The patient has no history of COPD.  Pt denies any significant sore throat, nasal congestion or excess secretions, fever, chills, sweats, unintended weight loss, pleurtic or exertional chest pain, orthopnea PND, or leg swelling Pt denies any increase in rescue therapy over baseline, denies waking up needing it or having any early am or nocturnal exacerbations of coughing/wheezing/or dyspnea. Pt also denies any obvious fluctuation in symptoms with  weather or environmental change or other alleviating or aggravating factors  03/02/2012  Pt had RHC : pulm HTN moderate.  Normal pcwp     Pt had sleep study: In the meantime dyspnea is better.  No real edema on feet.  No cough.  No chest pain.  Pt notes some pndrip. Note  LFTs normal  03/30/2012 Sleep study abnormal.  Elevated AHI.  Tracleer is too expensive.  Pt noted since in hospital 6/13,  Daytime sleepiness is better with nocturnal oxygen.  Pt is still fatigued.   Pt says pt makes a lot of noise when sleeping.    05/11/2012 Did not get a phone call from [facility].  sats 70s.  Already has nasal oxygen in the home. Pt has been very active .  No real chest pain.  No increase edema in feet.  No real cough   No change in fatigue Pt still  with throat clearing.  Throat is not dry or sore.    Past Medical History  Diagnosis Date  . Atrial fibrillation   . High cholesterol   . Hypertension   . Skin cancer   . Heart failure      Family History  Problem Relation Age of Onset  . Prostate cancer Neg Hx   . Colon cancer Neg Hx   . Breast cancer Mother   . Hypertension Neg Hx   . Heart disease Neg Hx   . Diabetes Son     2 sons     History   Social History  . Marital Status: Married    Spouse Name: N/A    Number of Children: N/A  . Years of Education: N/A   Occupational History  . retired     Set designer    Social History Main Topics  . Smoking status: Former Smoker -- 1.0 packs/day for 30 years    Types: Cigarettes, Pipe, Cigars    Quit date: 06/28/1978  . Smokeless tobacco: Never Used  . Alcohol Use: Yes  . Drug Use: No  . Sexually Active: Not on file   Other Topics Concern  . Not on file   Social History Narrative  . No narrative on file     No Known Allergies   Outpatient Prescriptions Prior to Visit  Medication Sig Dispense Refill  . amLODipine (NORVASC) 5 MG tablet Take 1  tablet (5 mg total) by mouth daily.  30 tablet  5  . aspirin 81 MG tablet Take 81 mg by mouth daily.      . digoxin (LANOXIN) 0.25 MG tablet Take 250 mcg by mouth daily.      . fish oil-omega-3 fatty acids 1000 MG capsule Take 500 mg by mouth 2 (two) times daily.       . furosemide (LASIX) 40 MG tablet Take 1 tablet (40 mg total) by mouth daily.  30 tablet  5  . isosorbide dinitrate (ISORDIL) 30 MG tablet Take 30 mg by mouth daily.      . metoprolol succinate (TOPROL-XL) 100 MG 24 hr tablet Take 1 tablet (100 mg total) by mouth daily. Take with or immediately following a meal.  30 tablet  5  . niacin (NIASPAN) 1000 MG CR tablet Take 1,000 mg by mouth at bedtime.      . potassium chloride SA (K-DUR,KLOR-CON) 20 MEQ tablet Take 1 tablet (20 mEq total) by mouth daily.  30 tablet  5  . pravastatin (PRAVACHOL) 40 MG tablet  Take 1 tablet (40 mg total) by mouth daily.  90 tablet  2  . warfarin (COUMADIN) 10 MG tablet Take As Directed.  60 tablet  5  . warfarin (COUMADIN) 5 MG tablet Take by mouth as directed.          Review of Systems  Constitutional: Negative for chills, diaphoresis, activity change, appetite change, fatigue and unexpected weight change.  HENT: Positive for congestion, sneezing and tinnitus. Negative for hearing loss, nosebleeds, facial swelling, mouth sores, trouble swallowing, neck stiffness, dental problem, voice change, postnasal drip, sinus pressure and ear discharge.   Eyes: Negative for photophobia, discharge, itching and visual disturbance.  Respiratory: Positive for cough. Negative for apnea, choking, chest tightness and stridor.   Cardiovascular: Negative for palpitations.  Gastrointestinal: Negative for nausea, constipation, blood in stool and abdominal distention.  Genitourinary: Negative for dysuria, urgency, frequency, hematuria, flank pain, decreased urine volume and difficulty urinating.  Musculoskeletal: Negative for myalgias, back pain, joint swelling, arthralgias and gait problem.  Skin: Negative for color change and pallor.  Neurological: Positive for dizziness and light-headedness. Negative for tremors, seizures, syncope, speech difficulty, weakness and numbness.  Hematological: Negative for adenopathy. Bruises/bleeds easily.  Psychiatric/Behavioral: Negative for confusion, sleep disturbance and agitation. The patient is not nervous/anxious.        Objective:   Physical Exam  Filed Vitals:   05/11/12 1020  BP: 96/60  Pulse: 58  Temp: 97.4 F (36.3 C)  TempSrc: Oral  Height: 5\' 7"  (1.702 m)  Weight: 169 lb (76.658 kg)  SpO2: 92%    Gen: Pleasant, well-nourished, in no distress,  normal affect  ENT: No lesions,  mouth clear,  oropharynx clear, no postnasal drip  Neck: 2+  JVD, no TMG, no carotid bruits  Lungs: No use of accessory muscles, no dullness to  percussion, clear without rales or rhonchi  Cardiovascular: RRR, split P2 otherwise  heart sounds normal, no murmur or gallops, no peripheral edema  Abdomen: soft and NT, no HSM,  BS normal  Musculoskeletal: No deformities, no cyanosis or clubbing  Neuro: alert, non focal  Skin: Warm, no lesions or rashes  05/11/2012  No desaturation with exertion noted today in the office on room air       Assessment & Plan:   Pulmonary hypertension Secondary pulmonary hypertension likely on the basis of severe obstructive sleep apnea. Unfortunately the patient has not yet received  CPAP therapy Patient will be receiving CPAP therapy in the next 24 hours. Continue oxygen therapy with CPAP at night Return 1 month  OSA (obstructive sleep apnea) Severe obstructive sleep apnea with secondary pulmonary hypertension Plan Obtain for the patient CPAP therapy    Updated Medication List Outpatient Encounter Prescriptions as of 05/11/2012  Medication Sig Dispense Refill  . amLODipine (NORVASC) 5 MG tablet Take 1 tablet (5 mg total) by mouth daily.  30 tablet  5  . aspirin 81 MG tablet Take 81 mg by mouth daily.      . digoxin (LANOXIN) 0.25 MG tablet Take 250 mcg by mouth daily.      . fish oil-omega-3 fatty acids 1000 MG capsule Take 500 mg by mouth 2 (two) times daily.       . furosemide (LASIX) 40 MG tablet Take 1 tablet (40 mg total) by mouth daily.  30 tablet  5  . isosorbide dinitrate (ISORDIL) 30 MG tablet Take 30 mg by mouth daily.      . metoprolol succinate (TOPROL-XL) 100 MG 24 hr tablet Take 1 tablet (100 mg total) by mouth daily. Take with or immediately following a meal.  30 tablet  5  . niacin (NIASPAN) 1000 MG CR tablet Take 1,000 mg by mouth at bedtime.      . potassium chloride SA (K-DUR,KLOR-CON) 20 MEQ tablet Take 1 tablet (20 mEq total) by mouth daily.  30 tablet  5  . pravastatin (PRAVACHOL) 40 MG tablet Take 1 tablet (40 mg total) by mouth daily.  90 tablet  2  . warfarin  (COUMADIN) 10 MG tablet Take As Directed.  60 tablet  5  . warfarin (COUMADIN) 5 MG tablet Take by mouth as directed.

## 2012-05-14 NOTE — Assessment & Plan Note (Signed)
Improving. Continue current dose with low fat diet and exercise. Recheck lipid profile prior to next visit

## 2012-05-14 NOTE — Progress Notes (Signed)
  Subjective:    Patient ID: Jordan Bautista, male    DOB: Apr 04, 1929, 78 y.o.   MRN: [id number]  HPI Pt presents to clinic for followup of multiple medical problems. Blood pressure he has normotensive. LDL cholesterol improved. Status post dose increase of statin therapy and tolerating without adverse effect. No active complaint.  Past Medical History  Diagnosis Date  . Atrial fibrillation   . High cholesterol   . Hypertension   . Skin cancer   . Heart failure    Past Surgical History  Procedure Date  . Basal cell carcinoma excision 2013    reports that he quit smoking about 33 years ago. His smoking use included Cigarettes, Pipe, and Cigars. He has a 30 pack-year smoking history. He has never used smokeless tobacco. He reports that he drinks alcohol. He reports that he does not use illicit drugs. family history includes Breast cancer in his mother and Diabetes in his son.  There is no history of Prostate cancer, and Colon cancer, and Hypertension, and Heart disease, . No Known Allergies    Review of Systems see hpi     Objective:   Physical Exam  Physical Exam  Nursing note and vitals reviewed. Constitutional: Appears well-developed and well-nourished. No distress.  HENT:  Head: Normocephalic and atraumatic.  Right Ear: External ear normal.  Left Ear: External ear normal.  Eyes: Conjunctivae are normal. No scleral icterus.  Neck: Neck supple. Carotid bruit is not present.  Cardiovascular: Normal rate, irregular rhythm and normal heart sounds.  Exam reveals no gallop and no friction rub.   No murmur heard. Pulmonary/Chest: Effort normal and breath sounds normal. No respiratory distress. He has no wheezes. no rales.  Lymphadenopathy:    He has no cervical adenopathy.  Neurological:Alert.  Skin: Skin is warm and dry. Not diaphoretic.  Psychiatric: Has a normal mood and affect.        Assessment & Plan:

## 2012-06-15 ENCOUNTER — Ambulatory Visit (INDEPENDENT_AMBULATORY_CARE_PROVIDER_SITE_OTHER): Payer: Medicare Other | Admitting: Critical Care Medicine

## 2012-06-15 ENCOUNTER — Encounter: Payer: Self-pay | Admitting: Critical Care Medicine

## 2012-06-15 VITALS — BP 120/84 | HR 70 | Temp 97.5°F | Ht 67.0 in | Wt 167.5 lb

## 2012-06-15 DIAGNOSIS — I272 Pulmonary hypertension, unspecified: Secondary | ICD-10-CM

## 2012-06-15 DIAGNOSIS — I2789 Other specified pulmonary heart diseases: Secondary | ICD-10-CM

## 2012-06-15 DIAGNOSIS — G4733 Obstructive sleep apnea (adult) (pediatric): Secondary | ICD-10-CM

## 2012-06-15 NOTE — Progress Notes (Signed)
Subjective:    Patient ID: Jordan Bautista, male    DOB: 1929/03/22, 78 y.o.   MRN: [id number]  HPI  This patient is referred for evaluation of pulmonary hypertension. The patient was just hospitalized between the fourth and seventh of June for heart failure. The patient was given Lasix. The patient's had his beta blocker increased. Heart rate control was administered because of atrial fibrillation. CT scan she has showed no pulmonary emboli. A sleep study was to be scheduled but has not yet been completed. The patient is now referred for evaluation of pulmonary hypertension.  Patient denies any cough or chest pain. There is mild postnasal drip. There is dyspnea with exertion but not at rest. Patient denies orthopnea or PND. The patient has no history of COPD.  Pt denies any significant sore throat, nasal congestion or excess secretions, fever, chills, sweats, unintended weight loss, pleurtic or exertional chest pain, orthopnea PND, or leg swelling Pt denies any increase in rescue therapy over baseline, denies waking up needing it or having any early am or nocturnal exacerbations of coughing/wheezing/or dyspnea. Pt also denies any obvious fluctuation in symptoms with  weather or environmental change or other alleviating or aggravating factors  03/02/2012  Pt had RHC : pulm HTN moderate.  Normal pcwp     Pt had sleep study: In the meantime dyspnea is better.  No real edema on feet.  No cough.  No chest pain.  Pt notes some pndrip. Note  LFTs normal  03/30/2012 Sleep study abnormal.  Elevated AHI.  Tracleer is too expensive.  Pt noted since in hospital 6/13,  Daytime sleepiness is better with nocturnal oxygen.  Pt is still fatigued.   Pt says pt makes a lot of noise when sleeping.    05/11/2012 Did not get a phone call from [facility].  sats 70s.  Already has nasal oxygen in the home. Pt has been very active .  No real chest pain.  No increase edema in feet.  No real cough   No change in fatigue Pt  still with throat clearing.  Throat is not dry or sore.  06/15/2012 Now wearing Cpap  Tolerating well. No real cough.  Pt is less fatigued.  Using oxygen with cpap. Needs an ONO with cpap on RA.      Past Medical History  Diagnosis Date  . Atrial fibrillation   . High cholesterol   . Hypertension   . Skin cancer   . Heart failure      Family History  Problem Relation Age of Onset  . Prostate cancer Neg Hx   . Colon cancer Neg Hx   . Breast cancer Mother   . Hypertension Neg Hx   . Heart disease Neg Hx   . Diabetes Son     2 sons     History   Social History  . Marital Status: Married    Spouse Name: N/A    Number of Children: N/A  . Years of Education: N/A   Occupational History  . retired     Set designer    Social History Main Topics  . Smoking status: Former Smoker -- 1.0 packs/day for 30 years    Types: Cigarettes, Pipe, Cigars    Quit date: 06/28/1978  . Smokeless tobacco: Never Used  . Alcohol Use: Yes  . Drug Use: No  . Sexually Active: Not on file   Other Topics Concern  . Not on file   Social History Narrative  . No  narrative on file     No Known Allergies   Outpatient Prescriptions Prior to Visit  Medication Sig Dispense Refill  . amLODipine (NORVASC) 5 MG tablet Take 1 tablet (5 mg total) by mouth daily.  30 tablet  5  . aspirin 81 MG tablet Take 81 mg by mouth daily.      . digoxin (LANOXIN) 0.25 MG tablet Take 250 mcg by mouth daily.      . fish oil-omega-3 fatty acids 1000 MG capsule Take 500 mg by mouth 2 (two) times daily.       . furosemide (LASIX) 40 MG tablet Take 1 tablet (40 mg total) by mouth daily.  30 tablet  5  . isosorbide dinitrate (ISORDIL) 30 MG tablet Take 30 mg by mouth daily.      . metoprolol succinate (TOPROL-XL) 100 MG 24 hr tablet Take 1 tablet (100 mg total) by mouth daily. Take with or immediately following a meal.  30 tablet  5  . niacin (NIASPAN) 1000 MG CR tablet Take 1,000 mg by mouth at bedtime.      .  potassium chloride SA (K-DUR,KLOR-CON) 20 MEQ tablet Take 1 tablet (20 mEq total) by mouth daily.  30 tablet  5  . pravastatin (PRAVACHOL) 40 MG tablet Take 1 tablet (40 mg total) by mouth daily.  90 tablet  2  . warfarin (COUMADIN) 10 MG tablet Take As Directed.  60 tablet  5  . warfarin (COUMADIN) 5 MG tablet Take by mouth as directed.      Last reviewed on 06/15/2012 10:00 AM by Storm Frisk, MD    Review of Systems  Constitutional: Negative for chills, diaphoresis, activity change, appetite change, fatigue and unexpected weight change.  HENT: Positive for congestion, sneezing and tinnitus. Negative for hearing loss, nosebleeds, facial swelling, mouth sores, trouble swallowing, neck stiffness, dental problem, voice change, postnasal drip, sinus pressure and ear discharge.   Eyes: Negative for photophobia, discharge, itching and visual disturbance.  Respiratory: Positive for cough. Negative for apnea, choking, chest tightness and stridor.   Cardiovascular: Negative for palpitations.  Gastrointestinal: Negative for nausea, constipation, blood in stool and abdominal distention.  Genitourinary: Negative for dysuria, urgency, frequency, hematuria, flank pain, decreased urine volume and difficulty urinating.  Musculoskeletal: Negative for myalgias, back pain, joint swelling, arthralgias and gait problem.  Skin: Negative for color change and pallor.  Neurological: Positive for dizziness and light-headedness. Negative for tremors, seizures, syncope, speech difficulty, weakness and numbness.  Hematological: Negative for adenopathy. Bruises/bleeds easily.  Psychiatric/Behavioral: Negative for confusion, sleep disturbance and agitation. The patient is not nervous/anxious.        Objective:   Physical Exam  Filed Vitals:   06/15/12 0949  BP: 120/84  Pulse: 70  Temp: 97.5 F (36.4 C)  TempSrc: Oral  Height: 5\' 7"  (1.702 m)  Weight: 167 lb 8 oz (75.978 kg)  SpO2: 94%    Gen: Pleasant,  well-nourished, in no distress,  normal affect  ENT: No lesions,  mouth clear,  oropharynx clear, no postnasal drip  Neck: 2+  JVD, no TMG, no carotid bruits  Lungs: No use of accessory muscles, no dullness to percussion, clear without rales or rhonchi  Cardiovascular: RRR, split P2 otherwise  heart sounds normal, no murmur or gallops, no peripheral edema  Abdomen: soft and NT, no HSM,  BS normal  Musculoskeletal: No deformities, no cyanosis or clubbing  Neuro: alert, non focal  Skin: Warm, no lesions or rashes  Assessment & Plan:   OSA (obstructive sleep apnea) Sleep study positive for desaturations and elevated AHI CPAP Rx 04/2012 Download 12/13:  Optimal pressure 13 cmh20  Good compliance >83% use of >/= 4hrs Significant sleep apnea likely the primary driver for pulmonary hypertension in this patient The patient has significantly benefited from CPAP therapy and is compliant Plan Obtain an overnight oxygen test on CPAP without oxygen Reset CPAP at 13 cm optimal pressure Return 4 months     Pulmonary hypertension Pulmonary hypertension is secondary and is on the basis of sleep apnea  Echo 6 /2013: EF 60%  TR  Severe pulm HTN CTA angio Chest 6/13: No PE.  No pulm fibrosis or emphysema by report or per my review Shan Levans)  PFTs : FeV1 95%  TLC 76%  DLCO 59% DL/Va 11% Sleep study moderate sleep apnea with AHI of 27  RHC August 2013 revealed pulmonary artery pressure 55-60/20 with mean of 30 mm mercury wedge pressure of 14 and normal left ventricular systolic function. No cardiac etiology for pulmonary hypertension seen. All serology neg  Plan Per sleep apnea as assessed No indication for endothelin receptor antagonist at this time     Updated Medication List Outpatient Encounter Prescriptions as of 06/15/2012  Medication Sig Dispense Refill  . amLODipine (NORVASC) 5 MG tablet Take 1 tablet (5 mg total) by mouth daily.  30 tablet  5  . aspirin 81 MG  tablet Take 81 mg by mouth daily.      . digoxin (LANOXIN) 0.25 MG tablet Take 250 mcg by mouth daily.      . fish oil-omega-3 fatty acids 1000 MG capsule Take 500 mg by mouth 2 (two) times daily.       . furosemide (LASIX) 40 MG tablet Take 1 tablet (40 mg total) by mouth daily.  30 tablet  5  . isosorbide dinitrate (ISORDIL) 30 MG tablet Take 30 mg by mouth daily.      . metoprolol succinate (TOPROL-XL) 100 MG 24 hr tablet Take 1 tablet (100 mg total) by mouth daily. Take with or immediately following a meal.  30 tablet  5  . niacin (NIASPAN) 1000 MG CR tablet Take 1,000 mg by mouth at bedtime.      . potassium chloride SA (K-DUR,KLOR-CON) 20 MEQ tablet Take 1 tablet (20 mEq total) by mouth daily.  30 tablet  5  . pravastatin (PRAVACHOL) 40 MG tablet Take 1 tablet (40 mg total) by mouth daily.  90 tablet  2  . warfarin (COUMADIN) 10 MG tablet Take As Directed.  60 tablet  5  . warfarin (COUMADIN) 5 MG tablet Take by mouth as directed.

## 2012-06-15 NOTE — Patient Instructions (Signed)
We will repeat ONO on Room air plus cpap We will change your cpap to fixed pressure at 13 cmh20 Return 4 months

## 2012-06-15 NOTE — Assessment & Plan Note (Signed)
Pulmonary hypertension is secondary and is on the basis of sleep apnea  Echo 6 /2013: EF 60%  TR  Severe pulm HTN CTA angio Chest 6/13: No PE.  No pulm fibrosis or emphysema by report or per my review Shan Levans)  PFTs : FeV1 95%  TLC 76%  DLCO 59% DL/Va 16% Sleep study moderate sleep apnea with AHI of 27  RHC August 2013 revealed pulmonary artery pressure 55-60/20 with mean of 30 mm mercury wedge pressure of 14 and normal left ventricular systolic function. No cardiac etiology for pulmonary hypertension seen. All serology neg  Plan Per sleep apnea as assessed No indication for endothelin receptor antagonist at this time

## 2012-06-15 NOTE — Assessment & Plan Note (Addendum)
Sleep study positive for desaturations and elevated AHI CPAP Rx 04/2012 Download 12/13:  Optimal pressure 13 cmh20  Good compliance >83% use of >/= 4hrs Significant sleep apnea likely the primary driver for pulmonary hypertension in this patient The patient has significantly benefited from CPAP therapy and is compliant Plan Obtain an overnight oxygen test on CPAP without oxygen Reset CPAP at 13 cm optimal pressure Return 4 months

## 2012-06-29 ENCOUNTER — Telehealth: Payer: Self-pay | Admitting: Internal Medicine

## 2012-06-29 MED ORDER — POTASSIUM CHLORIDE CRYS ER 20 MEQ PO TBCR: 20.0000 meq | EXTENDED_RELEASE_TABLET | Freq: Every day | ORAL | Status: DC

## 2012-06-29 MED ORDER — METOPROLOL SUCCINATE ER 100 MG PO TB24: 100.0000 mg | ORAL_TABLET | Freq: Every day | ORAL | Status: DC

## 2012-06-29 MED ORDER — FUROSEMIDE 40 MG PO TABS: 40.0000 mg | ORAL_TABLET | Freq: Every day | ORAL | Status: DC

## 2012-06-29 NOTE — Telephone Encounter (Signed)
Refill- furosemide 40mg  tab. Take one tablet by mouth every day. Qty 30 last fill 12.3.13  Refill- potassium chloride ter. Take one tablet by mouth every day. Qty 30 last fill 12.3.13  Refill- metoprolol succinate 100mg  ter. Take one tablet by mouth every day immediately following a meal. Qty 30 last fill 12.3.13

## 2012-06-29 NOTE — Telephone Encounter (Signed)
Rx[s] to pharmacy/[facility] 

## 2012-07-03 ENCOUNTER — Telehealth: Payer: Self-pay | Admitting: Critical Care Medicine

## 2012-07-03 DIAGNOSIS — R0902 Hypoxemia: Secondary | ICD-10-CM

## 2012-07-03 DIAGNOSIS — G4733 Obstructive sleep apnea (adult) (pediatric): Secondary | ICD-10-CM

## 2012-07-03 NOTE — Telephone Encounter (Signed)
Call the pt and tell him ONO positive for desaturations  He will need 3Liters via cpap at night  See orders

## 2012-07-03 NOTE — Telephone Encounter (Signed)
Order given to [email or web address]  to get 02 set up for this pt Jordan Bautista

## 2012-07-10 ENCOUNTER — Telehealth: Payer: Self-pay | Admitting: Critical Care Medicine

## 2012-07-10 NOTE — Telephone Encounter (Signed)
LMTCB x 1 

## 2012-07-11 NOTE — Telephone Encounter (Signed)
Pt returned triage's call.  Jordan Bautista ° °

## 2012-07-11 NOTE — Telephone Encounter (Signed)
Pt reports that [facility] called him and told him to increase oxygen with cpap to 3L at night due to ono results.  Pt reports that he is already on 3L with cpap.  Pt also reports that he was never contacted about increasing his cpap pressure to 13.  Pt reports that his machine still says 5 auto set.  Left message for Mayra Reel to see what pt's current orders are and what pt needs to do from here.

## 2012-07-11 NOTE — Telephone Encounter (Signed)
Spoke with Melissa (Liason w/ [facility]) she reports that when they set up pt on cpap in Novmeber he was already on oxygen 3L at night so they just bled this into Cpap. Please advise on most recent order to increase oxygen to 3L due to ono results since pt is already on 3L.   Melissa is also f/u on order to change cpap to set pressure of 13.

## 2012-07-11 NOTE — Telephone Encounter (Signed)
LMTC x 1 for Melissa liason for [facility].

## 2012-07-11 NOTE — Telephone Encounter (Signed)
Melissa from [facility] returned our call.  Call Melissa back @ [phone].  She stated she will now be handling our office. Leanora Ivanoff

## 2012-07-12 NOTE — Telephone Encounter (Signed)
I spoke with pt and I spoke with Melissa made them both aware of PW recs. They both voiced understanding of settings. Nothing further was needed

## 2012-07-12 NOTE — Telephone Encounter (Signed)
According to my records the 12/28 CPAP ONO was on RA , that is why I ordered the 3L with the Cpap Have the pt continue 3L with cpap at the new pressure settings of 13cmh20

## 2012-07-13 ENCOUNTER — Encounter: Payer: Self-pay | Admitting: Critical Care Medicine

## 2012-07-21 ENCOUNTER — Telehealth: Payer: Self-pay | Admitting: Critical Care Medicine

## 2012-07-21 NOTE — Telephone Encounter (Signed)
I spoke with patient about results and he verbalized understanding and had no questions 

## 2012-07-21 NOTE — Telephone Encounter (Signed)
Call pt and tell him i reviewed his ONO on RA with cpap He will need to stay on 3Lites of oxygen with cpap based upon this study.

## 2012-08-02 ENCOUNTER — Encounter: Payer: Self-pay | Admitting: Critical Care Medicine

## 2012-08-08 ENCOUNTER — Telehealth: Payer: Self-pay

## 2012-08-08 DIAGNOSIS — E785 Hyperlipidemia, unspecified: Secondary | ICD-10-CM

## 2012-08-08 NOTE — Telephone Encounter (Signed)
Labs ordered.

## 2012-08-09 LAB — LIPID PANEL
Cholesterol: 214 mg/dL — ABNORMAL HIGH (ref 0–200)
Triglycerides: 239 mg/dL — ABNORMAL HIGH (ref <150)

## 2012-08-14 ENCOUNTER — Ambulatory Visit (INDEPENDENT_AMBULATORY_CARE_PROVIDER_SITE_OTHER): Payer: Medicare Other | Admitting: Family Medicine

## 2012-08-14 ENCOUNTER — Encounter: Payer: Self-pay | Admitting: Family Medicine

## 2012-08-14 VITALS — BP 118/70 | HR 63 | Temp 97.4°F | Ht 67.0 in | Wt 170.1 lb

## 2012-08-14 DIAGNOSIS — G4733 Obstructive sleep apnea (adult) (pediatric): Secondary | ICD-10-CM

## 2012-08-14 DIAGNOSIS — R202 Paresthesia of skin: Secondary | ICD-10-CM

## 2012-08-14 DIAGNOSIS — E785 Hyperlipidemia, unspecified: Secondary | ICD-10-CM

## 2012-08-14 DIAGNOSIS — R5381 Other malaise: Secondary | ICD-10-CM

## 2012-08-14 DIAGNOSIS — M109 Gout, unspecified: Secondary | ICD-10-CM

## 2012-08-14 DIAGNOSIS — R209 Unspecified disturbances of skin sensation: Secondary | ICD-10-CM

## 2012-08-14 DIAGNOSIS — I4891 Unspecified atrial fibrillation: Secondary | ICD-10-CM

## 2012-08-14 MED ORDER — COLCHICINE 0.6 MG PO TABS: ORAL_TABLET | ORAL | Status: DC

## 2012-08-14 NOTE — Patient Instructions (Addendum)
Try switching to Flaxseed oil caps daily 2 daily from fish oil  Gout Gout is an inflammatory condition (arthritis) caused by a buildup of uric acid crystals in the joints. Uric acid is a chemical that is normally present in the blood. Under some circumstances, uric acid can form into crystals in your joints. This causes joint redness, soreness, and swelling (inflammation). Repeat attacks are common. Over time, uric acid crystals can form into masses (tophi) near a joint, causing disfigurement. Gout is treatable and often preventable. CAUSES  The disease begins with elevated levels of uric acid in the blood. Uric acid is produced by your body when it breaks down a naturally found substance called purines. This also happens when you eat certain foods such as meats and fish. Causes of an elevated uric acid level include:  Being passed down from parent to child (heredity).  Diseases that cause increased uric acid production (obesity, psoriasis, some cancers).  Excessive alcohol use.  Diet, especially diets rich in meat and seafood.  Medicines, including certain cancer-fighting drugs (chemotherapy), diuretics, and aspirin.  Chronic kidney disease. The kidneys are no longer able to remove uric acid well.  Problems with metabolism. Conditions strongly associated with gout include:  Obesity.  High blood pressure.  High cholesterol.  Diabetes. Not everyone with elevated uric acid levels gets gout. It is not understood why some people get gout and others do not. Surgery, joint injury, and eating too much of certain foods are some of the factors that can lead to gout. SYMPTOMS   An attack of gout comes on quickly. It causes intense pain with redness, swelling, and warmth in a joint.  Fever can occur.  Often, only one joint is involved. Certain joints are more commonly involved:  Base of the big toe.  Knee.  Ankle.  Wrist.  Finger. Without treatment, an attack usually goes away in  a few days to weeks. Between attacks, you usually will not have symptoms, which is different from many other forms of arthritis. DIAGNOSIS  Your caregiver will suspect gout based on your symptoms and exam. Removal of fluid from the joint (arthrocentesis) is done to check for uric acid crystals. Your caregiver will give you a medicine that numbs the area (local anesthetic) and use a needle to remove joint fluid for exam. Gout is confirmed when uric acid crystals are seen in joint fluid, using a special microscope. Sometimes, blood, urine, and X-ray tests are also used. TREATMENT  There are 2 phases to gout treatment: treating the sudden onset (acute) attack and preventing attacks (prophylaxis). Treatment of an Acute Attack  Medicines are used. These include anti-inflammatory medicines or steroid medicines.  An injection of steroid medicine into the affected joint is sometimes necessary.  The painful joint is rested. Movement can worsen the arthritis.  You may use warm or cold treatments on painful joints, depending which works best for you.  Discuss the use of coffee, vitamin C, or cherries with your caregiver. These may be helpful treatment options. Treatment to Prevent Attacks After the acute attack subsides, your caregiver may advise prophylactic medicine. These medicines either help your kidneys eliminate uric acid from your body or decrease your uric acid production. You may need to stay on these medicines for a very long time. The early phase of treatment with prophylactic medicine can be associated with an increase in acute gout attacks. For this reason, during the first few months of treatment, your caregiver may also advise you to take medicines  usually used for acute gout treatment. Be sure you understand your caregiver's directions. You should also discuss dietary treatment with your caregiver. Certain foods such as meats and fish can increase uric acid levels. Other foods such as dairy  can decrease levels. Your caregiver can give you a list of foods to avoid. HOME CARE INSTRUCTIONS   Do not take aspirin to relieve pain. This raises uric acid levels.  Only take over-the-counter or prescription medicines for pain, discomfort, or fever as directed by your caregiver.  Rest the joint as much as possible. When in bed, keep sheets and blankets off painful areas.  Keep the affected joint raised (elevated).  Use crutches if the painful joint is in your leg.  Drink enough water and fluids to keep your urine clear or pale yellow. This helps your body get rid of uric acid. Do not drink alcoholic beverages. They slow the passage of uric acid.  Follow your caregiver's dietary instructions. Pay careful attention to the amount of protein you eat. Your daily diet should emphasize fruits, vegetables, whole grains, and fat-free or low-fat milk products.  Maintain a healthy body weight. SEEK MEDICAL CARE IF:   You have an oral temperature above 102 F (38.9 C).  You develop diarrhea, vomiting, or any side effects from medicines.  You do not feel better in 24 hours, or you are getting worse. SEEK IMMEDIATE MEDICAL CARE IF:   Your joint becomes suddenly more tender and you have:  Chills.  An oral temperature above 102 F (38.9 C), not controlled by medicine. MAKE SURE YOU:   Understand these instructions.  Will watch your condition.  Will get help right away if you are not doing well or get worse. Document Released: 06/11/2000 Document Revised: 09/06/2011 Document Reviewed: 09/22/2009 [facility] Patient Information 2013 [place], [place].

## 2012-08-15 ENCOUNTER — Other Ambulatory Visit: Payer: Self-pay | Admitting: Family Medicine

## 2012-08-15 ENCOUNTER — Other Ambulatory Visit: Payer: Self-pay

## 2012-08-15 DIAGNOSIS — R5381 Other malaise: Secondary | ICD-10-CM

## 2012-08-15 DIAGNOSIS — M109 Gout, unspecified: Secondary | ICD-10-CM

## 2012-08-15 DIAGNOSIS — I272 Pulmonary hypertension, unspecified: Secondary | ICD-10-CM

## 2012-08-15 NOTE — Telephone Encounter (Signed)
Pt informed and voiced understanding. RX sent  Pt also stated that he got a call today about going to see Dr Delford Field and he believes MD has him mixed up with someone and he wants to clarify with md that he should go to this appt

## 2012-08-15 NOTE — Telephone Encounter (Signed)
So I did not get him confused with someone else, what happened Is I meant to put in a PT referral for debility and I hit pulmonology instead. I have placed the PT referral now. The Colchicine will only be used short term just to help with any flares in gout that can occur when we treat the uric acid levels. Have him only use the colchicine for about 3 weeks. Just warn him that their is a chance he will have some trouble with increased muscle aches while on the colchicine and Pravastin. We can hold one for a time if we need to.

## 2012-08-15 NOTE — Telephone Encounter (Signed)
Please advise? Is this ok to still send the Allopurinol and Colchicine?  The combination of pravastatin with colchicine may result in an increased potential (greater than for each agent alone) for myopathy.   Warning: The hypoprothrombinemic effect of warfarin may be increased by allopurinol

## 2012-08-15 NOTE — Telephone Encounter (Signed)
So we can use Allopurinol but an interaction came up that would require extra monitoring for a couple months. Uloric costs more but has less interactions. If he agrees to trying Uloric40 mg daily, disp #30 2 rf. When sending in the rx write in comments unable to take Allopurinol due to Coumadin use.

## 2012-08-16 MED ORDER — FEBUXOSTAT 40 MG PO TABS: 40.0000 mg | ORAL_TABLET | Freq: Every day | ORAL | Status: DC

## 2012-08-16 NOTE — Addendum Note (Signed)
Addended by: Court Joy on: 08/16/2012 01:54 PM   Modules accepted: Orders

## 2012-08-16 NOTE — Telephone Encounter (Signed)
rx sent

## 2012-08-16 NOTE — Telephone Encounter (Signed)
Pt informed and voiced understanding. The colchicine was sent in during pts appt. I will send Uloric to pharmacy.

## 2012-08-20 ENCOUNTER — Encounter: Payer: Self-pay | Admitting: Family Medicine

## 2012-08-20 DIAGNOSIS — M109 Gout, unspecified: Secondary | ICD-10-CM

## 2012-08-20 HISTORY — DX: Gout, unspecified: M10.9

## 2012-08-20 NOTE — Assessment & Plan Note (Signed)
Tolerating coumadin 

## 2012-08-20 NOTE — Assessment & Plan Note (Signed)
Mild, avoid tans fats, continue prvastatin

## 2012-08-20 NOTE — Assessment & Plan Note (Signed)
Uric acid very hi, but unable to tolerate several meds, will try Uloric and avoid fods hi in Purines

## 2012-08-20 NOTE — Assessment & Plan Note (Signed)
Compliant with cpap and O2

## 2012-08-20 NOTE — Progress Notes (Signed)
Patient ID: Jordan Bautista, male   DOB: 07-20-1928, 79 y.o.   MRN: [id number] AMARIO LONGMORE [id number] August 28, 1928 08/20/2012      Progress Note-Follow Up  Subjective  Chief Complaint  Chief Complaint  Patient presents with  . Follow-up    3 month    HPI  Patient is an 79 year old male in today for followup. Overall feeling well please frustrated with persistent fatigue. He has trouble intermittently with pain in his feet an admits  in the past he's been diagnosed with gout. He's had no foot pains associated redness or warmth. No trauma. No recent illness. No chest pain palpitations, shortness of breath, GI or GU concerns. Past Medical History  Diagnosis Date  . Atrial fibrillation   . High cholesterol   . Hypertension   . Skin cancer   . Heart failure   . Gout 08/20/2012    Past Surgical History  Procedure Laterality Date  . Basal cell carcinoma excision  2013    Family History  Problem Relation Age of Onset  . Prostate cancer Neg Hx   . Colon cancer Neg Hx   . Breast cancer Mother   . Hypertension Neg Hx   . Heart disease Neg Hx   . Diabetes Son     2 sons    History   Social History  . Marital Status: Married    Spouse Name: N/A    Number of Children: N/A  . Years of Education: N/A   Occupational History  . retired     Set designer    Social History Main Topics  . Smoking status: Former Smoker -- 1.00 packs/day for 30 years    Types: Cigarettes, Pipe, Cigars    Quit date: 06/28/1978  . Smokeless tobacco: Never Used  . Alcohol Use: Yes  . Drug Use: No  . Sexually Active: Not on file   Other Topics Concern  . Not on file   Social History Narrative  . No narrative on file    Current Outpatient Prescriptions on File Prior to Visit  Medication Sig Dispense Refill  . amLODipine (NORVASC) 5 MG tablet Take 1 tablet (5 mg total) by mouth daily.  30 tablet  5  . aspirin 81 MG tablet Take 81 mg by mouth daily.      . digoxin (LANOXIN) 0.25 MG tablet  Take 250 mcg by mouth daily.      . fish oil-omega-3 fatty acids 1000 MG capsule Take 500 mg by mouth 2 (two) times daily.       . furosemide (LASIX) 40 MG tablet Take 1 tablet (40 mg total) by mouth daily.  30 tablet  5  . isosorbide dinitrate (ISORDIL) 30 MG tablet Take 30 mg by mouth daily.      . metoprolol succinate (TOPROL-XL) 100 MG 24 hr tablet Take 1 tablet (100 mg total) by mouth daily. Take with or immediately following a meal.  30 tablet  5  . niacin (NIASPAN) 1000 MG CR tablet Take 1,000 mg by mouth at bedtime.      . potassium chloride SA (K-DUR,KLOR-CON) 20 MEQ tablet Take 1 tablet (20 mEq total) by mouth daily.  30 tablet  5  . pravastatin (PRAVACHOL) 40 MG tablet Take 1 tablet (40 mg total) by mouth daily.  90 tablet  2  . warfarin (COUMADIN) 10 MG tablet Take As Directed.  60 tablet  5  . warfarin (COUMADIN) 5 MG tablet Take by mouth as directed.  No current facility-administered medications on file prior to visit.    No Known Allergies  Review of Systems  Review of Systems  Constitutional: Negative for fever and malaise/fatigue.  HENT: Negative for congestion.   Eyes: Negative for discharge.  Respiratory: Negative for shortness of breath.   Cardiovascular: Negative for chest pain, palpitations and leg swelling.  Gastrointestinal: Negative for nausea, abdominal pain and diarrhea.  Genitourinary: Negative for dysuria.  Musculoskeletal: Negative for falls.  Skin: Negative for rash.  Neurological: Negative for loss of consciousness and headaches.  Endo/Heme/Allergies: Negative for polydipsia.  Psychiatric/Behavioral: Negative for depression and suicidal ideas. The patient is not nervous/anxious and does not have insomnia.     Objective  BP 118/70  Pulse 63  Temp(Src) 97.4 F (36.3 C) (Oral)  Ht 5\' 7"  (1.702 m)  Wt 170 lb 1.3 oz (77.148 kg)  BMI 26.63 kg/m2  SpO2 96%  Physical Exam  Physical Exam  Constitutional: He is oriented to person, place, and  time and well-developed, well-nourished, and in no distress. No distress.  HENT:  Head: Normocephalic and atraumatic.  Eyes: Conjunctivae are normal.  Neck: Neck supple. No thyromegaly present.  Cardiovascular: Normal heart sounds.   Irregularly irregular  Pulmonary/Chest: Effort normal and breath sounds normal. No respiratory distress.  Abdominal: He exhibits no distension and no mass. There is no tenderness.  Musculoskeletal: He exhibits no edema.  Neurological: He is alert and oriented to person, place, and time.  Skin: Skin is warm.  Psychiatric: Memory, affect and judgment normal.    Lab Results  Component Value Date   TSH 2.178 01/25/2012   Lab Results  Component Value Date   WBC 7.3 04/27/2012   HGB 15.2 04/27/2012   HCT 43.7 04/27/2012   MCV 87.2 04/27/2012   PLT 172 04/27/2012   Lab Results  Component Value Date   CREATININE 1.34 04/27/2012   BUN 31* 04/27/2012   NA 137 04/27/2012   K 4.3 04/27/2012   CL 101 04/27/2012   CO2 28 04/27/2012   Lab Results  Component Value Date   ALT 14 04/27/2012   AST 22 04/27/2012   ALKPHOS 44 04/27/2012   BILITOT 0.7 04/27/2012   Lab Results  Component Value Date   CHOL 214* 08/08/2012   Lab Results  Component Value Date   HDL 33* 08/08/2012   Lab Results  Component Value Date   LDLCALC 133* 08/08/2012   Lab Results  Component Value Date   TRIG 239* 08/08/2012   Lab Results  Component Value Date   CHOLHDL 6.5 08/08/2012     Assessment & Plan  OSA (obstructive sleep apnea) Compliant with cpap and O2  Atrial fibrillation Tolerating coumadin  Gout Uric acid very hi, but unable to tolerate several meds, will try Uloric and avoid fods hi in [place]  Hyperlipidemia Mild, avoid tans fats, continue prvastatin

## 2012-08-24 ENCOUNTER — Encounter: Payer: Self-pay | Admitting: Critical Care Medicine

## 2012-08-24 ENCOUNTER — Ambulatory Visit: Payer: Medicare Other | Attending: Family Medicine | Admitting: Rehabilitation

## 2012-08-24 ENCOUNTER — Ambulatory Visit (INDEPENDENT_AMBULATORY_CARE_PROVIDER_SITE_OTHER): Payer: Medicare Other | Admitting: Critical Care Medicine

## 2012-08-24 VITALS — BP 102/72 | HR 58 | Temp 97.5°F | Ht 67.0 in | Wt 169.0 lb

## 2012-08-24 DIAGNOSIS — I2789 Other specified pulmonary heart diseases: Secondary | ICD-10-CM

## 2012-08-24 DIAGNOSIS — IMO0001 Reserved for inherently not codable concepts without codable children: Secondary | ICD-10-CM | POA: Insufficient documentation

## 2012-08-24 DIAGNOSIS — M6281 Muscle weakness (generalized): Secondary | ICD-10-CM | POA: Insufficient documentation

## 2012-08-24 DIAGNOSIS — G4733 Obstructive sleep apnea (adult) (pediatric): Secondary | ICD-10-CM

## 2012-08-24 DIAGNOSIS — I272 Pulmonary hypertension, unspecified: Secondary | ICD-10-CM

## 2012-08-24 NOTE — Assessment & Plan Note (Signed)
Severe obstructive sleep apnea improved with CPAP therapy with optimal pressure 13 cm water and need for supplemental oxygen 3 L at bedtime with full face mask Plan Continue CPAP therapy No indication for additional medications for pulmonary hypertension Return 6 months

## 2012-08-24 NOTE — Progress Notes (Signed)
Subjective:    Patient ID: Jordan Bautista, male    DOB: 04/01/1929, 79 y.o.   MRN: [id number]  HPI  This patient is referred for evaluation of pulmonary hypertension. The patient was just hospitalized between the fourth and seventh of June for heart failure. The patient was given Lasix. The patient's had his beta blocker increased. Heart rate control was administered because of atrial fibrillation. CT scan she has showed no pulmonary emboli. A sleep study was to be scheduled but has not yet been completed. The patient is now referred for evaluation of pulmonary hypertension.  Patient denies any cough or chest pain. There is mild postnasal drip. There is dyspnea with exertion but not at rest. Patient denies orthopnea or PND. The patient has no history of COPD.  Pt denies any significant sore throat, nasal congestion or excess secretions, fever, chills, sweats, unintended weight loss, pleurtic or exertional chest pain, orthopnea PND, or leg swelling Pt denies any increase in rescue therapy over baseline, denies waking up needing it or having any early am or nocturnal exacerbations of coughing/wheezing/or dyspnea. Pt also denies any obvious fluctuation in symptoms with  weather or environmental change or other alleviating or aggravating factors  03/02/2012  Pt had [facility] : pulm HTN moderate.  Normal pcwp     Pt had sleep study: In the meantime dyspnea is better.  No real edema on feet.  No cough.  No chest pain.  Pt notes some pndrip. Note  LFTs normal  03/30/2012 Sleep study abnormal.  Elevated AHI.  Tracleer is too expensive.  Pt noted since in hospital 6/13,  Daytime sleepiness is better with nocturnal oxygen.  Pt is still fatigued.   Pt says pt makes a lot of noise when sleeping.    05/11/2012 Did not get a phone call from [facility].  sats 70s.  Already has nasal oxygen in the home. Pt has been very active .  No real chest pain.  No increase edema in feet.  No real cough   No change in fatigue Pt  still with throat clearing.  Throat is not dry or sore.  06/15/2012 Now wearing Cpap  Tolerating well. No real cough.  Pt is less fatigued.  Using oxygen with cpap. Needs an ONO with cpap on RA.     08/24/2012 F/u OSA likely primary cause of pulmonary HTN and not needing meds. No on Cpap 13cmh20 Notes no new changes , may be better with dyspnea.  No chest pain. No palpitations.  Using cpap nightly. Daytime sleepiness is better, less fatigue.  No true daytime hypersomnolence.  No awakenings in the PM.   Past Medical History  Diagnosis Date  . Atrial fibrillation   . High cholesterol   . Hypertension   . Skin cancer   . Heart failure   . Gout 08/20/2012     Family History  Problem Relation Age of Onset  . Prostate cancer Neg Hx   . Colon cancer Neg Hx   . Breast cancer Mother   . Hypertension Neg Hx   . Heart disease Neg Hx   . Diabetes Son     2 sons     History   Social History  . Marital Status: Married    Spouse Name: N/A    Number of Children: N/A  . Years of Education: N/A   Occupational History  . retired     Set designer    Social History Main Topics  . Smoking status: Former Smoker --  1.00 packs/day for 30 years    Types: Cigarettes, Pipe, Cigars    Quit date: 06/28/1978  . Smokeless tobacco: Never Used  . Alcohol Use: Yes  . Drug Use: No  . Sexually Active: Not on file   Other Topics Concern  . Not on file   Social History Narrative  . No narrative on file     No Known Allergies   Outpatient Prescriptions Prior to Visit  Medication Sig Dispense Refill  . amLODipine (NORVASC) 5 MG tablet Take 1 tablet (5 mg total) by mouth daily.  30 tablet  5  . aspirin 81 MG tablet Take 81 mg by mouth daily.      . digoxin (LANOXIN) 0.25 MG tablet Take 250 mcg by mouth daily.      . febuxostat (ULORIC) 40 MG tablet Take 1 tablet (40 mg total) by mouth daily.  30 tablet  2  . fish oil-omega-3 fatty acids 1000 MG capsule Take 500 mg by mouth 2 (two) times  daily.       . furosemide (LASIX) 40 MG tablet Take 1 tablet (40 mg total) by mouth daily.  30 tablet  5  . isosorbide dinitrate (ISORDIL) 30 MG tablet Take 30 mg by mouth daily.      . metoprolol succinate (TOPROL-XL) 100 MG 24 hr tablet Take 1 tablet (100 mg total) by mouth daily. Take with or immediately following a meal.  30 tablet  5  . niacin (NIASPAN) 1000 MG CR tablet Take 1,000 mg by mouth at bedtime.      . potassium chloride SA (K-DUR,KLOR-CON) 20 MEQ tablet Take 1 tablet (20 mEq total) by mouth daily.  30 tablet  5  . pravastatin (PRAVACHOL) 40 MG tablet Take 1 tablet (40 mg total) by mouth daily.  90 tablet  2  . warfarin (COUMADIN) 10 MG tablet Take As Directed.  60 tablet  5  . warfarin (COUMADIN) 5 MG tablet Take by mouth as directed.      Marland Kitchen allopurinol (ZYLOPRIM) 300 MG tablet Take 300 mg by mouth daily.      . colchicine 0.6 MG tablet 2 tabs po once and then 1 tab po every 2 hours til pain resolution or a max of 6 tabs or side  20 tablet  1   No facility-administered medications prior to visit.      Review of Systems  Constitutional: Negative for chills, diaphoresis, activity change, appetite change, fatigue and unexpected weight change.  HENT: Positive for tinnitus. Negative for hearing loss, nosebleeds, congestion, facial swelling, sneezing, mouth sores, trouble swallowing, neck stiffness, dental problem, voice change, postnasal drip, sinus pressure and ear discharge.   Eyes: Negative for photophobia, discharge, itching and visual disturbance.  Respiratory: Negative for apnea, cough, choking, chest tightness and stridor.   Cardiovascular: Negative for palpitations.  Gastrointestinal: Negative for nausea, constipation, blood in stool and abdominal distention.  Genitourinary: Negative for dysuria, urgency, frequency, hematuria, flank pain, decreased urine volume and difficulty urinating.  Musculoskeletal: Negative for myalgias, back pain, joint swelling, arthralgias and  gait problem.  Skin: Negative for color change and pallor.  Neurological: Negative for dizziness, tremors, seizures, syncope, speech difficulty, weakness, light-headedness and numbness.  Hematological: Negative for adenopathy. Bruises/bleeds easily.  Psychiatric/Behavioral: Negative for confusion, sleep disturbance and agitation. The patient is not nervous/anxious.        Objective:   Physical Exam  Filed Vitals:   08/24/12 1550  BP: 102/72  Pulse: 58  Temp: 97.5 F (36.4  C)  TempSrc: Oral  Height: 5\' 7"  (1.702 m)  Weight: 169 lb (76.658 kg)  SpO2: 93%    Gen: Pleasant, well-nourished, in no distress,  normal affect  ENT: No lesions,  mouth clear,  oropharynx clear, no postnasal drip  Neck: 1+  JVD, no TMG, no carotid bruits  Lungs: No use of accessory muscles, no dullness to percussion, clear without rales or rhonchi  Cardiovascular: RRR, split P2 otherwise  heart sounds normal, no murmur or gallops, no peripheral edema  Abdomen: soft and NT, no HSM,  BS normal  Musculoskeletal: No deformities, no cyanosis or clubbing  Neuro: alert, non focal  Skin: Warm, no lesions or rashes     Assessment & Plan:   Pulmonary hypertension Pulmonary hypertension on a secondary basis due to severe sleep apnea No other secondary cause of pulmonary hypertension discerned   OSA (obstructive sleep apnea) Severe obstructive sleep apnea improved with CPAP therapy with optimal pressure 13 cm water and need for supplemental oxygen 3 L at bedtime with full face mask Plan Continue CPAP therapy No indication for additional medications for pulmonary hypertension Return 6 months    Updated Medication List Outpatient Encounter Prescriptions as of 08/24/2012  Medication Sig Dispense Refill  . amLODipine (NORVASC) 5 MG tablet Take 1 tablet (5 mg total) by mouth daily.  30 tablet  5  . aspirin 81 MG tablet Take 81 mg by mouth daily.      . digoxin (LANOXIN) 0.25 MG tablet Take 250 mcg by  mouth daily.      . febuxostat (ULORIC) 40 MG tablet Take 1 tablet (40 mg total) by mouth daily.  30 tablet  2  . fish oil-omega-3 fatty acids 1000 MG capsule Take 500 mg by mouth 2 (two) times daily.       . furosemide (LASIX) 40 MG tablet Take 1 tablet (40 mg total) by mouth daily.  30 tablet  5  . isosorbide dinitrate (ISORDIL) 30 MG tablet Take 30 mg by mouth daily.      . metoprolol succinate (TOPROL-XL) 100 MG 24 hr tablet Take 1 tablet (100 mg total) by mouth daily. Take with or immediately following a meal.  30 tablet  5  . niacin (NIASPAN) 1000 MG CR tablet Take 1,000 mg by mouth at bedtime.      . potassium chloride SA (K-DUR,KLOR-CON) 20 MEQ tablet Take 1 tablet (20 mEq total) by mouth daily.  30 tablet  5  . pravastatin (PRAVACHOL) 40 MG tablet Take 1 tablet (40 mg total) by mouth daily.  90 tablet  2  . warfarin (COUMADIN) 10 MG tablet Take As Directed.  60 tablet  5  . warfarin (COUMADIN) 5 MG tablet Take by mouth as directed.      . [DISCONTINUED] allopurinol (ZYLOPRIM) 300 MG tablet Take 300 mg by mouth daily.      . colchicine 0.6 MG tablet 2 tabs po once and then 1 tab po every 2 hours til pain resolution or a max of 6 tabs or side  20 tablet  1   No facility-administered encounter medications on file as of 08/24/2012.

## 2012-08-24 NOTE — Assessment & Plan Note (Signed)
Pulmonary hypertension on a secondary basis due to severe sleep apnea No other secondary cause of pulmonary hypertension discerned

## 2012-08-24 NOTE — Patient Instructions (Signed)
Stay on cpap 13cmh20 full face mask plus 3Liter oxygen every night No change in medications Return 6 months

## 2012-09-18 ENCOUNTER — Telehealth: Payer: Self-pay | Admitting: Critical Care Medicine

## 2012-09-18 DIAGNOSIS — G4733 Obstructive sleep apnea (adult) (pediatric): Secondary | ICD-10-CM

## 2012-09-18 NOTE — Telephone Encounter (Signed)
Call pt and tell him I received his download report.  He is well controlled on current cpap settings and is very compliant No changes recommended.

## 2012-09-19 NOTE — Telephone Encounter (Signed)
Called, spoke with pt.  Informed him of below download results and recs per Dr. Delford Field.  He verbalized understanding of both and voiced no further questions or concerns at this time.

## 2012-09-28 ENCOUNTER — Telehealth: Payer: Self-pay

## 2012-09-28 MED ORDER — AMLODIPINE BESYLATE 5 MG PO TABS: 5.0000 mg | ORAL_TABLET | Freq: Every day | ORAL | Status: DC

## 2012-09-28 NOTE — Telephone Encounter (Signed)
Pt needs amlodipine sent to [facility]

## 2012-11-07 ENCOUNTER — Telehealth: Payer: Self-pay | Admitting: *Deleted

## 2012-11-07 DIAGNOSIS — M109 Gout, unspecified: Secondary | ICD-10-CM

## 2012-11-07 NOTE — Telephone Encounter (Signed)
Pt presented to the lab for labs per lab note below:  Notes Recorded by Bradd Canary, MD on 08/15/2012 at 12:40 PM Uric acid notably hi would start him on Allopurinol 300 mg po daily to bring it down, Disp #30, 3 rf and keep him on Colchicine 0.6 mg daily for 3 months, Disp #30 2 rf recheck uric acid in 10 to 12 weeks

## 2012-11-08 LAB — URIC ACID: Uric Acid, Serum: 7.6 mg/dL (ref 4.0–7.8)

## 2012-11-13 ENCOUNTER — Encounter: Payer: Self-pay | Admitting: Family Medicine

## 2012-11-13 ENCOUNTER — Ambulatory Visit (INDEPENDENT_AMBULATORY_CARE_PROVIDER_SITE_OTHER): Payer: Medicare Other | Admitting: Family Medicine

## 2012-11-13 VITALS — BP 120/72 | HR 53 | Temp 97.5°F | Ht 67.0 in | Wt 171.1 lb

## 2012-11-13 DIAGNOSIS — M109 Gout, unspecified: Secondary | ICD-10-CM

## 2012-11-13 DIAGNOSIS — Z7901 Long term (current) use of anticoagulants: Secondary | ICD-10-CM

## 2012-11-13 DIAGNOSIS — I4891 Unspecified atrial fibrillation: Secondary | ICD-10-CM

## 2012-11-13 DIAGNOSIS — E7800 Pure hypercholesterolemia, unspecified: Secondary | ICD-10-CM

## 2012-11-13 DIAGNOSIS — E785 Hyperlipidemia, unspecified: Secondary | ICD-10-CM

## 2012-11-13 MED ORDER — WARFARIN SODIUM 5 MG PO TABS: ORAL_TABLET | ORAL | Status: DC

## 2012-11-13 MED ORDER — FLAXSEED OIL 1000 MG PO CAPS: ORAL_CAPSULE | ORAL | Status: AC

## 2012-11-13 MED ORDER — TRAMADOL HCL 50 MG PO TABS: 50.0000 mg | ORAL_TABLET | Freq: Three times a day (TID) | ORAL | Status: DC | PRN

## 2012-11-13 MED ORDER — PREDNISONE 20 MG PO TABS: 20.0000 mg | ORAL_TABLET | Freq: Two times a day (BID) | ORAL | Status: DC

## 2012-11-13 MED ORDER — ALLOPURINOL 300 MG PO TABS: 300.0000 mg | ORAL_TABLET | Freq: Every day | ORAL | Status: DC

## 2012-11-13 NOTE — Progress Notes (Signed)
Patient ID: Jordan Bautista, male   DOB: 11-Dec-1928, 79 y.o.   MRN: [id number] Jordan Bautista [id number] 05-16-1929 11/13/2012      Progress Note-Follow Up  Subjective  Chief Complaint  Chief Complaint  Patient presents with  . Follow-up    3 week    HPI  Patient is a 79 yo male in today for follow up. No recent illness. No CP/palp/SOB/fevers/HA/GI or Gu co. Is noting some swelling and pain in left knee, is painful enough to cause him some difficulty walking. Uses Aleve with good results when he needs to, very infrequently. Eating and sleeping well  Past Medical History  Diagnosis Date  . Atrial fibrillation   . High cholesterol   . Hypertension   . Skin cancer   . Heart failure   . Gout 08/20/2012    Past Surgical History  Procedure Laterality Date  . Basal cell carcinoma excision  2013    Family History  Problem Relation Age of Onset  . Prostate cancer Neg Hx   . Colon cancer Neg Hx   . Breast cancer Mother   . Hypertension Neg Hx   . Heart disease Neg Hx   . Diabetes Son     2 sons    History   Social History  . Marital Status: Married    Spouse Name: N/A    Number of Children: N/A  . Years of Education: N/A   Occupational History  . retired     Set designer    Social History Main Topics  . Smoking status: Former Smoker -- 1.00 packs/day for 30 years    Types: Cigarettes, Pipe, Cigars    Quit date: 06/28/1978  . Smokeless tobacco: Never Used  . Alcohol Use: Yes  . Drug Use: No  . Sexually Active: Not on file   Other Topics Concern  . Not on file   Social History Narrative  . No narrative on file    Current Outpatient Prescriptions on File Prior to Visit  Medication Sig Dispense Refill  . amLODipine (NORVASC) 5 MG tablet Take 1 tablet (5 mg total) by mouth daily.  30 tablet  3  . aspirin 81 MG tablet Take 81 mg by mouth daily.      . colchicine 0.6 MG tablet 2 tabs po once and then 1 tab po every 2 hours til pain resolution or a max of 6  tabs or side  20 tablet  1  . digoxin (LANOXIN) 0.25 MG tablet Take 250 mcg by mouth daily.      . furosemide (LASIX) 40 MG tablet Take 1 tablet (40 mg total) by mouth daily.  30 tablet  5  . isosorbide dinitrate (ISORDIL) 30 MG tablet Take 30 mg by mouth daily.      . metoprolol succinate (TOPROL-XL) 100 MG 24 hr tablet Take 1 tablet (100 mg total) by mouth daily. Take with or immediately following a meal.  30 tablet  5  . niacin (NIASPAN) 1000 MG CR tablet Take 1,000 mg by mouth at bedtime.      . potassium chloride SA (K-DUR,KLOR-CON) 20 MEQ tablet Take 1 tablet (20 mEq total) by mouth daily.  30 tablet  5   No current facility-administered medications on file prior to visit.    No Known Allergies  Review of Systems  Review of Systems  Constitutional: Negative for fever and malaise/fatigue.  HENT: Negative for congestion.   Eyes: Negative for discharge.  Respiratory: Negative for shortness of  breath.   Cardiovascular: Negative for chest pain, palpitations and leg swelling.  Gastrointestinal: Negative for nausea, abdominal pain and diarrhea.  Genitourinary: Negative for dysuria.  Musculoskeletal: Negative for falls.  Skin: Negative for rash.  Neurological: Negative for loss of consciousness and headaches.  Endo/Heme/Allergies: Negative for polydipsia.  Psychiatric/Behavioral: Negative for depression and suicidal ideas. The patient is not nervous/anxious and does not have insomnia.     Objective  BP 120/72  Pulse 53  Temp(Src) 97.5 F (36.4 C) (Oral)  Ht 5\' 7"  (1.702 m)  Wt 171 lb 1.3 oz (77.601 kg)  BMI 26.79 kg/m2  SpO2 89%  Physical Exam  Physical Exam  Constitutional: He is oriented to person, place, and time and well-developed, well-nourished, and in no distress. No distress.  HENT:  Head: Normocephalic and atraumatic.  Eyes: Conjunctivae are normal.  Neck: Neck supple. No thyromegaly present.  Cardiovascular: Normal rate and normal heart sounds.   Irregularly  irregular  Pulmonary/Chest: Effort normal and breath sounds normal. No respiratory distress.  Abdominal: He exhibits no distension and no mass. There is no tenderness.  Musculoskeletal: He exhibits no edema.  Neurological: He is alert and oriented to person, place, and time.  Skin: Skin is warm.  Psychiatric: Memory, affect and judgment normal.    Lab Results  Component Value Date   TSH 2.178 01/25/2012   Lab Results  Component Value Date   WBC 7.3 04/27/2012   HGB 15.2 04/27/2012   HCT 43.7 04/27/2012   MCV 87.2 04/27/2012   PLT 172 04/27/2012   Lab Results  Component Value Date   CREATININE 1.34 04/27/2012   BUN 31* 04/27/2012   NA 137 04/27/2012   K 4.3 04/27/2012   CL 101 04/27/2012   CO2 28 04/27/2012   Lab Results  Component Value Date   ALT 14 04/27/2012   AST 22 04/27/2012   ALKPHOS 44 04/27/2012   BILITOT 0.7 04/27/2012   Lab Results  Component Value Date   CHOL 214* 08/08/2012   Lab Results  Component Value Date   HDL 33* 08/08/2012   Lab Results  Component Value Date   LDLCALC 133* 08/08/2012   Lab Results  Component Value Date   TRIG 239* 08/08/2012   Lab Results  Component Value Date   CHOLHDL 6.5 08/08/2012     Assessment & Plan  Hyperlipidemia D/c Pravastatin and start Atorvastatin for improved control  Chronic anticoagulation Doing well followed at [facility] but requesting rx for coumadin here with other meds. Given rx today  Atrial fibrillation Rate controlled tolerating Coumadin

## 2012-11-13 NOTE — Patient Instructions (Addendum)
Take Uloric til gone and then start Allopurinol 300 mg daily  Next visit with labs prior lipid, renal, cbc, tsh, uric acid   Gout Gout is an inflammatory condition (arthritis) caused by a buildup of uric acid crystals in the joints. Uric acid is a chemical that is normally present in the blood. Under some circumstances, uric acid can form into crystals in your joints. This causes joint redness, soreness, and swelling (inflammation). Repeat attacks are common. Over time, uric acid crystals can form into masses (tophi) near a joint, causing disfigurement. Gout is treatable and often preventable. CAUSES  The disease begins with elevated levels of uric acid in the blood. Uric acid is produced by your body when it breaks down a naturally found substance called purines. This also happens when you eat certain foods such as meats and fish. Causes of an elevated uric acid level include:  Being passed down from parent to child (heredity).  Diseases that cause increased uric acid production (obesity, psoriasis, some cancers).  Excessive alcohol use.  Diet, especially diets rich in meat and seafood.  Medicines, including certain cancer-fighting drugs (chemotherapy), diuretics, and aspirin.  Chronic kidney disease. The kidneys are no longer able to remove uric acid well.  Problems with metabolism. Conditions strongly associated with gout include:  Obesity.  High blood pressure.  High cholesterol.  Diabetes. Not everyone with elevated uric acid levels gets gout. It is not understood why some people get gout and others do not. Surgery, joint injury, and eating too much of certain foods are some of the factors that can lead to gout. SYMPTOMS   An attack of gout comes on quickly. It causes intense pain with redness, swelling, and warmth in a joint.  Fever can occur.  Often, only one joint is involved. Certain joints are more commonly involved:  Base of the big  toe.  Knee.  Ankle.  Wrist.  Finger. Without treatment, an attack usually goes away in a few days to weeks. Between attacks, you usually will not have symptoms, which is different from many other forms of arthritis. DIAGNOSIS  Your caregiver will suspect gout based on your symptoms and exam. Removal of fluid from the joint (arthrocentesis) is done to check for uric acid crystals. Your caregiver will give you a medicine that numbs the area (local anesthetic) and use a needle to remove joint fluid for exam. Gout is confirmed when uric acid crystals are seen in joint fluid, using a special microscope. Sometimes, blood, urine, and X-ray tests are also used. TREATMENT  There are 2 phases to gout treatment: treating the sudden onset (acute) attack and preventing attacks (prophylaxis). Treatment of an Acute Attack  Medicines are used. These include anti-inflammatory medicines or steroid medicines.  An injection of steroid medicine into the affected joint is sometimes necessary.  The painful joint is rested. Movement can worsen the arthritis.  You may use warm or cold treatments on painful joints, depending which works best for you.  Discuss the use of coffee, vitamin C, or cherries with your caregiver. These may be helpful treatment options. Treatment to Prevent Attacks After the acute attack subsides, your caregiver may advise prophylactic medicine. These medicines either help your kidneys eliminate uric acid from your body or decrease your uric acid production. You may need to stay on these medicines for a very long time. The early phase of treatment with prophylactic medicine can be associated with an increase in acute gout attacks. For this reason, during the first few  months of treatment, your caregiver may also advise you to take medicines usually used for acute gout treatment. Be sure you understand your caregiver's directions. You should also discuss dietary treatment with your  caregiver. Certain foods such as meats and fish can increase uric acid levels. Other foods such as dairy can decrease levels. Your caregiver can give you a list of foods to avoid. HOME CARE INSTRUCTIONS   Do not take aspirin to relieve pain. This raises uric acid levels.  Only take over-the-counter or prescription medicines for pain, discomfort, or fever as directed by your caregiver.  Rest the joint as much as possible. When in bed, keep sheets and blankets off painful areas.  Keep the affected joint raised (elevated).  Use crutches if the painful joint is in your leg.  Drink enough water and fluids to keep your urine clear or pale yellow. This helps your body get rid of uric acid. Do not drink alcoholic beverages. They slow the passage of uric acid.  Follow your caregiver's dietary instructions. Pay careful attention to the amount of protein you eat. Your daily diet should emphasize fruits, vegetables, whole grains, and fat-free or low-fat milk products.  Maintain a healthy body weight. SEEK MEDICAL CARE IF:   You have an oral temperature above 102 F (38.9 C).  You develop diarrhea, vomiting, or any side effects from medicines.  You do not feel better in 24 hours, or you are getting worse. SEEK IMMEDIATE MEDICAL CARE IF:   Your joint becomes suddenly more tender and you have:  Chills.  An oral temperature above 102 F (38.9 C), not controlled by medicine. MAKE SURE YOU:   Understand these instructions.  Will watch your condition.  Will get help right away if you are not doing well or get worse. Document Released: 06/11/2000 Document Revised: 09/06/2011 Document Reviewed: 09/22/2009 [facility] Patient Information 2013 [place], [place].

## 2012-11-15 NOTE — Assessment & Plan Note (Signed)
D/c Pravastatin and start Atorvastatin for improved control

## 2012-11-15 NOTE — Assessment & Plan Note (Signed)
Doing well followed at [facility] but requesting rx for coumadin here with other meds. Given rx today

## 2012-11-15 NOTE — Assessment & Plan Note (Signed)
Rate controlled tolerating Coumadin 

## 2012-11-21 ENCOUNTER — Encounter: Payer: Self-pay | Admitting: Cardiology

## 2012-12-04 ENCOUNTER — Other Ambulatory Visit: Payer: Self-pay

## 2012-12-04 MED ORDER — DIGOXIN 250 MCG PO TABS: 250.0000 ug | ORAL_TABLET | Freq: Every day | ORAL | Status: DC

## 2012-12-28 ENCOUNTER — Other Ambulatory Visit: Payer: Self-pay | Admitting: *Deleted

## 2012-12-28 MED ORDER — ISOSORBIDE DINITRATE 30 MG PO TABS: 30.0000 mg | ORAL_TABLET | Freq: Four times a day (QID) | ORAL | Status: DC

## 2012-12-28 NOTE — Telephone Encounter (Signed)
eScribe request for refill on Isosorbide Dinitrate 30 mg Last filled - 07.02.2013, #120x5 Last AEX - 05.19.14 Please advise on refills/SLS

## 2013-01-30 ENCOUNTER — Telehealth: Payer: Self-pay | Admitting: *Deleted

## 2013-01-30 DIAGNOSIS — I4891 Unspecified atrial fibrillation: Secondary | ICD-10-CM

## 2013-01-30 DIAGNOSIS — M109 Gout, unspecified: Secondary | ICD-10-CM

## 2013-01-30 DIAGNOSIS — I272 Pulmonary hypertension, unspecified: Secondary | ICD-10-CM

## 2013-01-30 DIAGNOSIS — E785 Hyperlipidemia, unspecified: Secondary | ICD-10-CM

## 2013-01-30 LAB — LIPID PANEL
Cholesterol: 129 mg/dL (ref 0–200)
HDL: 28 mg/dL — ABNORMAL LOW (ref >39)
LDL Cholesterol: 77 mg/dL (ref 0–99)
Triglycerides: 119 mg/dL (ref <150)

## 2013-01-30 LAB — RENAL FUNCTION PANEL
CO2: 29 mEq/L (ref 19–32)
Chloride: 103 mEq/L (ref 96–112)
Creat: 1.41 mg/dL — ABNORMAL HIGH (ref 0.50–1.35)
Glucose, Bld: 100 mg/dL — ABNORMAL HIGH (ref 70–99)

## 2013-01-30 LAB — CBC
HCT: 41.8 % (ref 39.0–52.0)
MCV: 83.6 fL (ref 78.0–100.0)
RDW: 17.2 % — ABNORMAL HIGH (ref 11.5–15.5)
WBC: 7.3 10*3/uL (ref 4.0–10.5)

## 2013-01-30 NOTE — Telephone Encounter (Signed)
Pt presented to the lab. Orders entered per 10/2012 office note as below:  Next visit with labs prior lipid, renal, cbc, tsh, uric acid

## 2013-02-06 ENCOUNTER — Ambulatory Visit (INDEPENDENT_AMBULATORY_CARE_PROVIDER_SITE_OTHER): Payer: Medicare Other | Admitting: Family Medicine

## 2013-02-06 ENCOUNTER — Encounter: Payer: Self-pay | Admitting: Family Medicine

## 2013-02-06 VITALS — BP 118/70 | HR 60 | Temp 97.5°F | Ht 67.0 in | Wt 172.0 lb

## 2013-02-06 DIAGNOSIS — N289 Disorder of kidney and ureter, unspecified: Secondary | ICD-10-CM

## 2013-02-06 DIAGNOSIS — I2789 Other specified pulmonary heart diseases: Secondary | ICD-10-CM

## 2013-02-06 DIAGNOSIS — I4891 Unspecified atrial fibrillation: Secondary | ICD-10-CM

## 2013-02-06 DIAGNOSIS — E860 Dehydration: Secondary | ICD-10-CM

## 2013-02-06 DIAGNOSIS — I509 Heart failure, unspecified: Secondary | ICD-10-CM

## 2013-02-06 DIAGNOSIS — G4733 Obstructive sleep apnea (adult) (pediatric): Secondary | ICD-10-CM

## 2013-02-06 DIAGNOSIS — I272 Pulmonary hypertension, unspecified: Secondary | ICD-10-CM

## 2013-02-06 HISTORY — DX: Dehydration: E86.0

## 2013-02-06 NOTE — Patient Instructions (Addendum)
For the swelling in the feet then add some compression hose low pressure at 10-20 mmHG, knee highs on in am off in pm, good company is [employer]  Can find them on the internet, or at a medical supply store  Call us if you are more tired or swelling is worse so we can repeat Echo on heart.  Try the nasal saline  Flushes to the nose if congestion persists  Hydrate with 1 gatorade (6-8 oz) with golf, otherwise water Make sure protein before you go out to play golf  DASH Diet The DASH diet stands for "Dietary Approaches to Stop Hypertension." It is a healthy eating plan that has been shown to reduce high blood pressure (hypertension) in as little as 14 days, while also possibly providing other significant health benefits. These other health benefits include reducing the risk of breast cancer after menopause and reducing the risk of type 2 diabetes, heart disease, colon cancer, and stroke. Health benefits also include weight loss and slowing kidney failure in patients with chronic kidney disease.  DIET GUIDELINES  Limit salt (sodium). Your diet should contain less than 1500 mg of sodium daily.  Limit refined or processed carbohydrates. Your diet should include mostly whole grains. Desserts and added sugars should be used sparingly.  Include small amounts of heart-healthy fats. These types of fats include nuts, oils, and tub margarine. Limit saturated and trans fats. These fats have been shown to be harmful in the body. CHOOSING FOODS  The following food groups are based on a 2000 calorie diet. See your Registered Dietitian for individual calorie needs. Grains and Grain Products (6 to 8 servings daily)  Eat More Often: Whole-wheat bread, brown rice, whole-grain or wheat pasta, quinoa, popcorn without added fat or salt (air popped).  Eat Less Often: White bread, white pasta, white rice, cornbread. Vegetables (4 to 5 servings daily)  Eat More Often: Fresh, frozen, and canned vegetables. Vegetables may  be raw, steamed, roasted, or grilled with a minimal amount of fat.  Eat Less Often/Avoid: Creamed or fried vegetables. Vegetables in a cheese sauce. Fruit (4 to 5 servings daily)  Eat More Often: All fresh, canned (in natural juice), or frozen fruits. Dried fruits without added sugar. One hundred percent fruit juice ( cup [237 mL] daily).  Eat Less Often: Dried fruits with added sugar. Canned fruit in light or heavy syrup. [employer], Fish, and Poultry (2 servings or less daily. One serving is 3 to 4 oz [85-114 g]).  Eat More Often: Ninety percent or leaner ground beef, tenderloin, sirloin. Round cuts of beef, chicken breast, [place] breast. All fish. Grill, bake, or broil your meat. Nothing should be fried.  Eat Less Often/Avoid: Fatty cuts of meat, [place], or chicken leg, thigh, or wing. Fried cuts of meat or fish. Dairy (2 to 3 servings)  Eat More Often: Low-fat or fat-free milk, low-fat plain or light yogurt, reduced-fat or part-skim cheese.  Eat Less Often/Avoid: Milk (whole, 2%).Whole milk yogurt. Full-fat cheeses. Nuts, Seeds, and Legumes (4 to 5 servings per week)  Eat More Often: All without added salt.  Eat Less Often/Avoid: Salted nuts and seeds, canned beans with added salt. Fats and Sweets (limited)  Eat More Often: Vegetable oils, tub margarines without trans fats, sugar-free gelatin. Mayonnaise and salad dressings.  Eat Less Often/Avoid: Coconut oils, palm oils, butter, stick margarine, cream, half and half, cookies, candy, pie. FOR MORE INFORMATION The Dash Diet Eating Plan: www.dashdiet.org Document Released: 06/03/2011 Document Revised: 09/06/2011 Document Reviewed:  06/03/2011 [employer] Patient Information 2014 [place], [place].

## 2013-02-06 NOTE — Progress Notes (Signed)
Patient ID: Jordan Bautista, male   DOB: [birth year], 79 y.o.   MRN: [id number] Jordan Bautista [id number] April 17, 1929 02/06/2013      Progress Note-Follow Up  Subjective  Chief Complaint  Chief Complaint  Patient presents with  . Follow-up    HPI  Patient is an 79 year old male who is in today for followup. Overall he is doing well but he does acknowledge that recently complain golf he felt slightly more fatigued than he usually does. He notes since then especially on hot days and he notes he also has oh is eating before he goes out. He is complaining of feeling very dry and congested when he first was in the morning after wearing his CPAP. Has a cough intermittently throughout the day but it is dry and not overwhelming. No fevers or chills no malaise or myalgias. No ear pain or sore throat. GI or GU complaints  Past Medical History  Diagnosis Date  . Atrial fibrillation   . High cholesterol   . Hypertension   . Skin cancer   . Heart failure   . Gout 08/20/2012  . Dehydration 02/06/2013    Past Surgical History  Procedure Laterality Date  . Basal cell carcinoma excision  2013    Family History  Problem Relation Age of Onset  . Prostate cancer Neg Hx   . Colon cancer Neg Hx   . Breast cancer Mother   . Hypertension Neg Hx   . Heart disease Neg Hx   . Diabetes Son     2 sons    History   Social History  . Marital Status: Married    Spouse Name: N/A    Number of Children: N/A  . Years of Education: N/A   Occupational History  . retired     Set designer    Social History Main Topics  . Smoking status: Former Smoker -- 1.00 packs/day for 30 years    Types: Cigarettes, Pipe, Cigars    Quit date: 06/28/1978  . Smokeless tobacco: Never Used  . Alcohol Use: Yes  . Drug Use: No  . Sexually Active: Not on file   Other Topics Concern  . Not on file   Social History Narrative  . No narrative on file    Current Outpatient Prescriptions on File Prior to Visit   Medication Sig Dispense Refill  . allopurinol (ZYLOPRIM) 300 MG tablet Take 1 tablet (300 mg total) by mouth daily.  30 tablet  2  . amLODipine (NORVASC) 5 MG tablet Take 1 tablet (5 mg total) by mouth daily.  30 tablet  3  . aspirin 81 MG tablet Take 81 mg by mouth daily.      Marland Kitchen atorvastatin (LIPITOR) 40 MG tablet Take 40 mg by mouth daily.      . digoxin (LANOXIN) 0.25 MG tablet Take 1 tablet (250 mcg total) by mouth daily.  90 tablet  1  . Flaxseed, Linseed, (FLAXSEED OIL) 1000 MG CAPS 1 cap twice a day    0  . furosemide (LASIX) 40 MG tablet Take 1 tablet (40 mg total) by mouth daily.  30 tablet  5  . metoprolol succinate (TOPROL-XL) 100 MG 24 hr tablet Take 1 tablet (100 mg total) by mouth daily. Take with or immediately following a meal.  30 tablet  5  . niacin (NIASPAN) 1000 MG CR tablet Take 1,000 mg by mouth at bedtime.      . potassium chloride SA (K-DUR,KLOR-CON) 20 MEQ tablet Take 1  tablet (20 mEq total) by mouth daily.  30 tablet  5  . warfarin (COUMADIN) 5 MG tablet 5 mg on Sun, Weds 7.5 mg on Sat, Mon, Tues, Thurs, Fri As directed  120 tablet  1   No current facility-administered medications on file prior to visit.    No Known Allergies  Review of Systems  Review of Systems  Constitutional: Positive for malaise/fatigue. Negative for fever.  HENT: Negative for congestion.   Eyes: Negative for pain and discharge.  Respiratory: Positive for shortness of breath.   Cardiovascular: Positive for leg swelling. Negative for chest pain and palpitations.  Gastrointestinal: Negative for nausea, abdominal pain and diarrhea.  Genitourinary: Negative for dysuria.  Musculoskeletal: Negative for falls.  Skin: Negative for rash.  Neurological: Negative for loss of consciousness and headaches.  Endo/Heme/Allergies: Negative for polydipsia.  Psychiatric/Behavioral: Negative for depression and suicidal ideas. The patient is not nervous/anxious and does not have insomnia.      Objective  BP 118/70  Pulse 60  Temp(Src) 97.5 F (36.4 C) (Oral)  Ht 5\' 7"  (1.702 m)  Wt 172 lb 0.6 oz (78.037 kg)  BMI 26.94 kg/m2  SpO2 90%  Physical Exam  Physical Exam  Constitutional: He is oriented to person, place, and time and well-developed, well-nourished, and in no distress. No distress.  HENT:  Head: Normocephalic and atraumatic.  Eyes: Conjunctivae are normal.  Neck: Neck supple. No thyromegaly present.  Cardiovascular: Normal rate.  Exam reveals no gallop.   Murmur heard. Pulmonary/Chest: Effort normal and breath sounds normal. No respiratory distress.  Abdominal: He exhibits no distension and no mass. There is no tenderness.  Musculoskeletal: He exhibits edema.  Trace pedal edema b/l LE  Neurological: He is alert and oriented to person, place, and time.  Skin: Skin is warm.  Psychiatric: Memory, affect and judgment normal.    Lab Results  Component Value Date   TSH 2.224 01/30/2013   Lab Results  Component Value Date   WBC 7.3 01/30/2013   HGB 14.2 01/30/2013   HCT 41.8 01/30/2013   MCV 83.6 01/30/2013   PLT 158 01/30/2013   Lab Results  Component Value Date   CREATININE 1.41* 01/30/2013   BUN 29* 01/30/2013   NA 139 01/30/2013   K 4.8 01/30/2013   CL 103 01/30/2013   CO2 29 01/30/2013   Lab Results  Component Value Date   ALT 14 04/27/2012   AST 22 04/27/2012   ALKPHOS 44 04/27/2012   BILITOT 0.7 04/27/2012   Lab Results  Component Value Date   CHOL 129 01/30/2013   Lab Results  Component Value Date   HDL 28* 01/30/2013   Lab Results  Component Value Date   LDLCALC 77 01/30/2013   Lab Results  Component Value Date   TRIG 119 01/30/2013   Lab Results  Component Value Date   CHOLHDL 4.6 01/30/2013     Assessment & Plan  Atrial fibrillation Rate controlled, no changes  Pulmonary hypertension Patient has follow up with pulmonology soon. Patient is struggling with a slight increase in SOB recently, mostly with exertion in hot sticky weather.    Heart failure Slight increase in edema recently will continue Lasix at current level and try DASH diet, elevate feet above heart and try compression hose.   OSA (obstructive sleep apnea) Using CPAP nightly but waking up congested and dry each am, encouraged improved hydration and reprot worsening symptoms  Dehydration Discussed need to hydrate well and eat before going out to  play golf in hot weather. Discussed need for protein with each meal

## 2013-02-06 NOTE — Assessment & Plan Note (Signed)
Slight increase in edema recently will continue Lasix at current level and try DASH diet, elevate feet above heart and try compression hose.

## 2013-02-06 NOTE — Assessment & Plan Note (Signed)
Patient has follow up with pulmonology soon. Patient is struggling with a slight increase in SOB recently, mostly with exertion in hot sticky weather.

## 2013-02-06 NOTE — Assessment & Plan Note (Signed)
Discussed need to hydrate well and eat before going out to play golf in hot weather. Discussed need for protein with each meal

## 2013-02-06 NOTE — Assessment & Plan Note (Signed)
Rate controlled, no changes 

## 2013-02-06 NOTE — Assessment & Plan Note (Signed)
Using CPAP nightly but waking up congested and dry each am, encouraged improved hydration and reprot worsening symptoms

## 2013-02-07 LAB — RENAL FUNCTION PANEL
BUN: 37 mg/dL — ABNORMAL HIGH (ref 6–23)
Calcium: 9.8 mg/dL (ref 8.4–10.5)
Chloride: 95 mEq/L — ABNORMAL LOW (ref 96–112)
Glucose, Bld: 98 mg/dL (ref 70–99)
Potassium: 4.6 mEq/L (ref 3.5–5.3)

## 2013-02-07 NOTE — Addendum Note (Signed)
Addended by: Court Joy on: 02/07/2013 01:02 PM   Modules accepted: Orders

## 2013-02-14 ENCOUNTER — Ambulatory Visit: Payer: BC Managed Care – HMO | Admitting: Family Medicine

## 2013-02-22 ENCOUNTER — Ambulatory Visit (INDEPENDENT_AMBULATORY_CARE_PROVIDER_SITE_OTHER): Payer: Medicare Other | Admitting: Critical Care Medicine

## 2013-02-22 ENCOUNTER — Encounter: Payer: Self-pay | Admitting: Critical Care Medicine

## 2013-02-22 VITALS — BP 130/82 | HR 56 | Temp 97.7°F | Ht 67.0 in | Wt 174.0 lb

## 2013-02-22 DIAGNOSIS — I2789 Other specified pulmonary heart diseases: Secondary | ICD-10-CM

## 2013-02-22 DIAGNOSIS — I272 Pulmonary hypertension, unspecified: Secondary | ICD-10-CM

## 2013-02-22 LAB — RENAL FUNCTION PANEL
BUN: 34 mg/dL — ABNORMAL HIGH (ref 6–23)
CO2: 30 mEq/L (ref 19–32)
Chloride: 104 mEq/L (ref 96–112)
Creat: 1.49 mg/dL — ABNORMAL HIGH (ref 0.50–1.35)

## 2013-02-22 MED ORDER — MACITENTAN 10 MG PO TABS: 10.0000 mg | ORAL_TABLET | Freq: Every day | ORAL | Status: DC

## 2013-02-22 NOTE — Progress Notes (Signed)
Subjective:    Patient ID: Jordan Bautista, male    DOB: [birth year], 79 y.o.   MRN: [id number]  HPI  This patient is referred for evaluation of pulmonary hypertension. The patient was just hospitalized between the fourth and seventh of June for heart failure. The patient was given Lasix. The patient's had his beta blocker increased. Heart rate control was administered because of atrial fibrillation. CT scan she has showed no pulmonary emboli. A sleep study was to be scheduled but has not yet been completed. The patient is now referred for evaluation of pulmonary hypertension.  Patient denies any cough or chest pain. There is mild postnasal drip. There is dyspnea with exertion but not at rest. Patient denies orthopnea or PND. The patient has no history of COPD.  Pt denies any significant sore throat, nasal congestion or excess secretions, fever, chills, sweats, unintended weight loss, pleurtic or exertional chest pain, orthopnea PND, or leg swelling Pt denies any increase in rescue therapy over baseline, denies waking up needing it or having any early am or nocturnal exacerbations of coughing/wheezing/or dyspnea. Pt also denies any obvious fluctuation in symptoms with  weather or environmental change or other alleviating or aggravating factors  03/02/2012  Pt had RHC : pulm HTN moderate.  Normal pcwp     Pt had sleep study: In the meantime dyspnea is better.  No real edema on feet.  No cough.  No chest pain.  Pt notes some pndrip. Note  LFTs normal  03/30/2012 Sleep study abnormal.  Elevated AHI.  Tracleer is too expensive.  Pt noted since in hospital 6/13,  Daytime sleepiness is better with nocturnal oxygen.  Pt is still fatigued.   Pt says pt makes a lot of noise when sleeping.    05/11/2012 Did not get a phone call from [facility].  sats 70s.  Already has nasal oxygen in the home. Pt has been very active .  No real chest pain.  No increase edema in feet.  No real cough   No change in fatigue Pt  still with throat clearing.  Throat is not dry or sore.  06/15/2012 Now wearing Cpap  Tolerating well. No real cough.  Pt is less fatigued.  Using oxygen with cpap. Needs an ONO with cpap on RA.     08/24/2012 F/u OSA likely primary cause of pulmonary HTN and not needing meds. No on Cpap 13cmh20 Notes no new changes , may be better with dyspnea.  No chest pain. No palpitations.  Using cpap nightly. Daytime sleepiness is better, less fatigue.  No true daytime hypersomnolence.  No awakenings in the PM.  02/22/2013 Chief Complaint  Patient presents with  . 6 month follow up    o2 sat 87% on RA upon arrival to exam room.  Has SOB/chest tightness more often since last visit and cough with small amount of clear mucus.  Wearing cpap everynight for 7-8 hours.  No problems with mask or pressure.   Pt more dyspneic than before and chest is tight. Cpap used every night.  Pt notes more ankle edema.  Dyspnea is worse. No qhs symptoms.  No orthopnea.  Pt denies fatigue in daytime.  Plays golf 2-3 x per week. Weight up 10#    Past Medical History  Diagnosis Date  . Atrial fibrillation   . High cholesterol   . Hypertension   . Skin cancer   . Heart failure   . Gout 08/20/2012  . Dehydration 02/06/2013  Family History  Problem Relation Age of Onset  . Prostate cancer Neg Hx   . Colon cancer Neg Hx   . Breast cancer Mother   . Hypertension Neg Hx   . Heart disease Neg Hx   . Diabetes Son     2 sons     History   Social History  . Marital Status: Married    Spouse Name: N/A    Number of Children: N/A  . Years of Education: N/A   Occupational History  . retired     Set designer    Social History Main Topics  . Smoking status: Former Smoker -- 1.00 packs/day for 30 years    Types: Cigarettes, Pipe, Cigars    Quit date: 06/28/1978  . Smokeless tobacco: Never Used  . Alcohol Use: Yes  . Drug Use: No  . Sexual Activity: Not on file   Other Topics Concern  . Not on file    Social History Narrative  . No narrative on file     No Known Allergies   Outpatient Prescriptions Prior to Visit  Medication Sig Dispense Refill  . allopurinol (ZYLOPRIM) 300 MG tablet Take 1 tablet (300 mg total) by mouth daily.  30 tablet  2  . amLODipine (NORVASC) 5 MG tablet Take 1 tablet (5 mg total) by mouth daily.  30 tablet  3  . aspirin 81 MG tablet Take 81 mg by mouth daily.      Marland Kitchen atorvastatin (LIPITOR) 40 MG tablet Take 40 mg by mouth daily.      . digoxin (LANOXIN) 0.25 MG tablet Take 1 tablet (250 mcg total) by mouth daily.  90 tablet  1  . Flaxseed, Linseed, (FLAXSEED OIL) 1000 MG CAPS 1 cap twice a day    0  . furosemide (LASIX) 40 MG tablet Take 1 tablet (40 mg total) by mouth daily.  30 tablet  5  . isosorbide dinitrate (ISORDIL) 30 MG tablet Take 30 mg by mouth daily.      . metoprolol succinate (TOPROL-XL) 100 MG 24 hr tablet Take 1 tablet (100 mg total) by mouth daily. Take with or immediately following a meal.  30 tablet  5  . naproxen sodium (ANAPROX) 220 MG tablet Take 220 mg by mouth as needed.      . niacin (NIASPAN) 1000 MG CR tablet Take 1,000 mg by mouth at bedtime.      . potassium chloride SA (K-DUR,KLOR-CON) 20 MEQ tablet Take 1 tablet (20 mEq total) by mouth daily.  30 tablet  5  . warfarin (COUMADIN) 5 MG tablet 5 mg on Sun, Weds 7.5 mg on Sat, Mon, Tues, Thurs, Fri As directed  120 tablet  1   No facility-administered medications prior to visit.      Review of Systems  Constitutional: Negative for chills, diaphoresis, activity change, appetite change, fatigue and unexpected weight change.  HENT: Positive for tinnitus. Negative for hearing loss, nosebleeds, congestion, facial swelling, sneezing, mouth sores, trouble swallowing, neck stiffness, dental problem, voice change, postnasal drip, sinus pressure and ear discharge.   Eyes: Negative for photophobia, discharge, itching and visual disturbance.  Respiratory: Negative for apnea, cough,  choking, chest tightness and stridor.   Cardiovascular: Negative for palpitations.  Gastrointestinal: Negative for nausea, constipation, blood in stool and abdominal distention.  Genitourinary: Negative for dysuria, urgency, frequency, hematuria, flank pain, decreased urine volume and difficulty urinating.  Musculoskeletal: Negative for myalgias, back pain, joint swelling, arthralgias and gait problem.  Skin: Negative for  color change and pallor.  Neurological: Negative for dizziness, tremors, seizures, syncope, speech difficulty, weakness, light-headedness and numbness.  Hematological: Negative for adenopathy. Bruises/bleeds easily.  Psychiatric/Behavioral: Negative for confusion, sleep disturbance and agitation. The patient is not nervous/anxious.        Objective:   Physical Exam  Filed Vitals:   02/22/13 1018 02/22/13 1020  BP:  130/82  Pulse:  56  Temp:  97.7 F (36.5 C)  TempSrc:  Oral  Height:  5\' 7"  (1.702 m)  Weight:  174 lb (78.926 kg)  SpO2: 87% 92%    Gen: Pleasant, well-nourished, in no distress,  normal affect  ENT: No lesions,  mouth clear,  oropharynx clear, no postnasal drip  Neck: 1+  JVD, no TMG, no carotid bruits  Lungs: No use of accessory muscles, no dullness to percussion, clear without rales or rhonchi  Cardiovascular: RRR, split P2 otherwise  heart sounds normal, no murmur or gallops, +++ peripheral edema  Abdomen: soft and NT, no HSM,  BS normal  Musculoskeletal: No deformities, no cyanosis or clubbing  Neuro: alert, non focal  Skin: Warm, no lesions or rashes     Assessment & Plan:   Pulmonary hypertension Severe pulmonary hypertension initially thought to be primarily from sleep apnea however the patient is now progressing with more fluid retention and more dyspnea. My suspicion is that of pulmonary hypertension is in fact worsening despite optimal CPAP therapy Plan Access Opsumit 10mg  /d (macitantan) Repeat  echocardiogram     Updated Medication List Outpatient Encounter Prescriptions as of 02/22/2013  Medication Sig Dispense Refill  . allopurinol (ZYLOPRIM) 300 MG tablet Take 1 tablet (300 mg total) by mouth daily.  30 tablet  2  . amLODipine (NORVASC) 5 MG tablet Take 1 tablet (5 mg total) by mouth daily.  30 tablet  3  . aspirin 81 MG tablet Take 81 mg by mouth daily.      Marland Kitchen atorvastatin (LIPITOR) 40 MG tablet Take 40 mg by mouth daily.      . digoxin (LANOXIN) 0.25 MG tablet Take 1 tablet (250 mcg total) by mouth daily.  90 tablet  1  . Flaxseed, Linseed, (FLAXSEED OIL) 1000 MG CAPS 1 cap twice a day    0  . furosemide (LASIX) 40 MG tablet Take 1 tablet (40 mg total) by mouth daily.  30 tablet  5  . isosorbide dinitrate (ISORDIL) 30 MG tablet Take 30 mg by mouth daily.      . metoprolol succinate (TOPROL-XL) 100 MG 24 hr tablet Take 1 tablet (100 mg total) by mouth daily. Take with or immediately following a meal.  30 tablet  5  . naproxen sodium (ANAPROX) 220 MG tablet Take 220 mg by mouth as needed.      . niacin (NIASPAN) 1000 MG CR tablet Take 1,000 mg by mouth at bedtime.      . potassium chloride SA (K-DUR,KLOR-CON) 20 MEQ tablet Take 1 tablet (20 mEq total) by mouth daily.  30 tablet  5  . warfarin (COUMADIN) 5 MG tablet 5 mg on Sun, Weds 7.5 mg on Sat, Mon, Tues, Thurs, Fri As directed  120 tablet  1  . Macitentan (OPSUMIT) 10 MG TABS Take 10 mg by mouth daily.  30 tablet  6   No facility-administered encounter medications on file as of 02/22/2013.

## 2013-02-22 NOTE — Patient Instructions (Addendum)
An echocardiogram will be obtained We will begin process for obtaining Opsumit (macietantan) for your pulmonary hypertension No other medication changes Return 3-4 weeks

## 2013-02-23 NOTE — Assessment & Plan Note (Signed)
Severe pulmonary hypertension initially thought to be primarily from sleep apnea however the patient is now progressing with more fluid retention and more dyspnea. My suspicion is that of pulmonary hypertension is in fact worsening despite optimal CPAP therapy Plan Access Opsumit 10mg  /d (macitantan) Repeat echocardiogram

## 2013-03-05 ENCOUNTER — Other Ambulatory Visit: Payer: Self-pay | Admitting: *Deleted

## 2013-03-05 MED ORDER — POTASSIUM CHLORIDE CRYS ER 20 MEQ PO TBCR: 20.0000 meq | EXTENDED_RELEASE_TABLET | Freq: Every day | ORAL | Status: DC

## 2013-03-05 MED ORDER — METOPROLOL SUCCINATE ER 100 MG PO TB24: 100.0000 mg | ORAL_TABLET | Freq: Every day | ORAL | Status: DC

## 2013-03-05 MED ORDER — FUROSEMIDE 40 MG PO TABS: 40.0000 mg | ORAL_TABLET | Freq: Every day | ORAL | Status: DC

## 2013-03-05 NOTE — Progress Notes (Signed)
Quick Note:  Patient Informed and voiced understanding ______ 

## 2013-03-05 NOTE — Telephone Encounter (Signed)
Rx request to pharmacy/[facility]  

## 2013-03-08 ENCOUNTER — Telehealth: Payer: Self-pay | Admitting: Critical Care Medicine

## 2013-03-08 NOTE — Telephone Encounter (Signed)
Jordan Bautista spoke to pt.  Pt has an echo sched 9/24 @ [facility].  We are moving appt w/ PW to 03/29/13.   Nothing further needed.   [name]

## 2013-03-15 ENCOUNTER — Ambulatory Visit: Payer: BC Managed Care – HMO | Admitting: Critical Care Medicine

## 2013-03-16 ENCOUNTER — Telehealth: Payer: Self-pay | Admitting: Critical Care Medicine

## 2013-03-16 ENCOUNTER — Other Ambulatory Visit: Payer: Self-pay | Admitting: Family Medicine

## 2013-03-16 NOTE — Telephone Encounter (Signed)
We did receive this fax, and it has been signed by PW.  I have faxed this back to [employer] at [phone]. Tobi Bastos aware and voiced no further questions or concerns at this time.

## 2013-03-16 NOTE — Telephone Encounter (Signed)
Spoke w Minette Headland from [employer] A form was faxed to Dr. Delford Field so that he could be certified to prescribe Opsumit Dr. Delford Field or [name] have you all seen this form, she state it was sent yesterday to front fax If form not received call back [phone]

## 2013-03-19 ENCOUNTER — Telehealth: Payer: Self-pay | Admitting: Critical Care Medicine

## 2013-03-19 NOTE — Telephone Encounter (Signed)
The form received today wasn't for a PA.  It was just acknowledging that The new pt enrollement form had been processed.   I spoke with PW about this.  I will have him sign PA that is at [facility] office.

## 2013-03-19 NOTE — Telephone Encounter (Signed)
After 30 minutes on phone and several transfers I was advised the correct number is [id number]. Fax is being sent. I will await fax and then place in PW box. Carron Curie, CMA

## 2013-03-19 NOTE — Telephone Encounter (Signed)
Form placed in PW look-at.Jordan Bautista, CMA

## 2013-03-19 NOTE — Telephone Encounter (Signed)
No, I do not have a PA for this.  I haven't seen one either.  Thank you for helping with this.

## 2013-03-19 NOTE — Telephone Encounter (Signed)
[name] do you have a from on this patient? If not please send back to triage so we can initiate PA. Thanks!Carron Curie, CMA

## 2013-03-19 NOTE — Telephone Encounter (Signed)
It came today to me and i gave to [employer]

## 2013-03-21 ENCOUNTER — Telehealth: Payer: Self-pay | Admitting: Critical Care Medicine

## 2013-03-21 NOTE — Telephone Encounter (Signed)
Duplicate msg, CJ working on this and will be signed when PW returns to the office

## 2013-03-22 NOTE — Telephone Encounter (Signed)
I have faxed the Prior Auth back for Opsumit. Called [employer] at [phone], spoke with [place].   She is aware I have faxed PA today. She is requesting we fax approval letter, if approved, to [phone]. Per Glynis Smiles, if we receive a denial, she will be able to help with the denial process.  She will be going out of office on Monday for vacation and will return on Oct 13. Will hold msg to f/u on.

## 2013-03-26 ENCOUNTER — Ambulatory Visit (INDEPENDENT_AMBULATORY_CARE_PROVIDER_SITE_OTHER): Payer: Medicare Other | Admitting: Critical Care Medicine

## 2013-03-26 ENCOUNTER — Encounter: Payer: Self-pay | Admitting: Critical Care Medicine

## 2013-03-26 VITALS — BP 118/76 | HR 56 | Temp 97.9°F | Ht 67.0 in | Wt 175.0 lb

## 2013-03-26 DIAGNOSIS — Z23 Encounter for immunization: Secondary | ICD-10-CM

## 2013-03-26 DIAGNOSIS — I272 Pulmonary hypertension, unspecified: Secondary | ICD-10-CM

## 2013-03-26 DIAGNOSIS — I2789 Other specified pulmonary heart diseases: Secondary | ICD-10-CM

## 2013-03-26 NOTE — Patient Instructions (Addendum)
A flu vaccine was given Start the new medication, once daily Opsumit We will obtaine the echo report from [facility] Return one month with liver function studies and CBC

## 2013-03-26 NOTE — Assessment & Plan Note (Signed)
Severe pulm HTN , failed rx of OSA alone  Plan A flu vaccine was given Start the new medication, once daily Opsumit 10mg 

## 2013-03-26 NOTE — Progress Notes (Signed)
Subjective:    Patient ID: Jordan Bautista, male    DOB: February 02, 1929, 79 y.o.   MRN: [id number]  HPI  This patient is referred for evaluation of pulmonary hypertension. The patient was just hospitalized between the fourth and seventh of June for heart failure. The patient was given Lasix. The patient's had his beta blocker increased. Heart rate control was administered because of atrial fibrillation. CT scan she has showed no pulmonary emboli. A sleep study was to be scheduled but has not yet been completed. The patient is now referred for evaluation of pulmonary hypertension.  Patient denies any cough or chest pain. There is mild postnasal drip. There is dyspnea with exertion but not at rest. Patient denies orthopnea or PND. The patient has no history of COPD.  Pt denies any significant sore throat, nasal congestion or excess secretions, fever, chills, sweats, unintended weight loss, pleurtic or exertional chest pain, orthopnea PND, or leg swelling Pt denies any increase in rescue therapy over baseline, denies waking up needing it or having any early am or nocturnal exacerbations of coughing/wheezing/or dyspnea. Pt also denies any obvious fluctuation in symptoms with  weather or environmental change or other alleviating or aggravating factors  03/02/2012  Pt had RHC : pulm HTN moderate.  Normal pcwp     Pt had sleep study: In the meantime dyspnea is better.  No real edema on feet.  No cough.  No chest pain.  Pt notes some pndrip. Note  LFTs normal  03/30/2012 Sleep study abnormal.  Elevated AHI.  Tracleer is too expensive.  Pt noted since in hospital 6/13,  Daytime sleepiness is better with nocturnal oxygen.  Pt is still fatigued.   Pt says pt makes a lot of noise when sleeping.    05/11/2012 Did not get a phone call from [facility].  sats 70s.  Already has nasal oxygen in the home. Pt has been very active .  No real chest pain.  No increase edema in feet.  No real cough   No change in fatigue Pt  still with throat clearing.  Throat is not dry or sore.  06/15/2012 Now wearing Cpap  Tolerating well. No real cough.  Pt is less fatigued.  Using oxygen with cpap. Needs an ONO with cpap on RA.     08/24/2012 F/u OSA likely primary cause of pulmonary HTN and not needing meds. No on Cpap 13cmh20 Notes no new changes , may be better with dyspnea.  No chest pain. No palpitations.  Using cpap nightly. Daytime sleepiness is better, less fatigue.  No true daytime hypersomnolence.  No awakenings in the PM.  02/22/2013 Chief Complaint  Patient presents with  . 6 month follow up    o2 sat 87% on RA upon arrival to exam room.  Has SOB/chest tightness more often since last visit and cough with small amount of clear mucus.  Wearing cpap everynight for 7-8 hours.  No problems with mask or pressure.   Pt more dyspneic than before and chest is tight. Cpap used every night.  Pt notes more ankle edema.  Dyspnea is worse. No qhs symptoms.  No orthopnea.  Pt denies fatigue in daytime.  Plays golf 2-3 x per week. Weight up 10#  03/26/2013 Chief Complaint  Patient presents with  . 1 month follow up    Breathing isn't doing as well since last OV - has more DOE/chest tightness and cough with very little mucus.  Now is worse with dyspnea.  Legs have  some edema.  No real chest pain.  No qhs problems, on cpap.     Past Medical History  Diagnosis Date  . Atrial fibrillation   . High cholesterol   . Hypertension   . Skin cancer   . Heart failure   . Gout 08/20/2012  . Dehydration 02/06/2013     Family History  Problem Relation Age of Onset  . Prostate cancer Neg Hx   . Colon cancer Neg Hx   . Breast cancer Mother   . Hypertension Neg Hx   . Heart disease Neg Hx   . Diabetes Son     2 sons     History   Social History  . Marital Status: Married    Spouse Name: N/A    Number of Children: N/A  . Years of Education: N/A   Occupational History  . retired     Set designer    Social History  Main Topics  . Smoking status: Former Smoker -- 1.00 packs/day for 30 years    Types: Cigarettes, Pipe, Cigars    Quit date: 06/28/1978  . Smokeless tobacco: Never Used  . Alcohol Use: Yes  . Drug Use: No  . Sexual Activity: Not on file   Other Topics Concern  . Not on file   Social History Narrative  . No narrative on file     No Known Allergies   Outpatient Prescriptions Prior to Visit  Medication Sig Dispense Refill  . allopurinol (ZYLOPRIM) 300 MG tablet TAKE ONE (1) TABLET EACH DAY  30 tablet  3  . amLODipine (NORVASC) 5 MG tablet Take 1 tablet (5 mg total) by mouth daily.  30 tablet  3  . aspirin 81 MG tablet Take 81 mg by mouth daily.      Marland Kitchen atorvastatin (LIPITOR) 40 MG tablet Take 40 mg by mouth daily.      . digoxin (LANOXIN) 0.25 MG tablet Take 1 tablet (250 mcg total) by mouth daily.  90 tablet  1  . Flaxseed, Linseed, (FLAXSEED OIL) 1000 MG CAPS 1 cap twice a day    0  . furosemide (LASIX) 40 MG tablet Take 1 tablet (40 mg total) by mouth daily.  30 tablet  5  . isosorbide dinitrate (ISORDIL) 30 MG tablet Take 30 mg by mouth daily.      . metoprolol succinate (TOPROL-XL) 100 MG 24 hr tablet Take 1 tablet (100 mg total) by mouth daily. Take with or immediately following a meal.  30 tablet  5  . naproxen sodium (ANAPROX) 220 MG tablet Take 220 mg by mouth as needed.      . niacin (NIASPAN) 1000 MG CR tablet Take 1,000 mg by mouth at bedtime.      . potassium chloride SA (K-DUR,KLOR-CON) 20 MEQ tablet Take 1 tablet (20 mEq total) by mouth daily.  30 tablet  5  . warfarin (COUMADIN) 5 MG tablet 5 mg on Sun, Weds 7.5 mg on Sat, Mon, Tues, Thurs, Fri As directed  120 tablet  1  . Macitentan (OPSUMIT) 10 MG TABS Take 10 mg by mouth daily.  30 tablet  6   No facility-administered medications prior to visit.      Review of Systems  Constitutional: Negative for chills, diaphoresis, activity change, appetite change, fatigue and unexpected weight change.  HENT: Positive  for tinnitus. Negative for hearing loss, nosebleeds, congestion, facial swelling, sneezing, mouth sores, trouble swallowing, neck stiffness, dental problem, voice change, postnasal drip, sinus pressure and ear  discharge.   Eyes: Negative for photophobia, discharge, itching and visual disturbance.  Respiratory: Negative for apnea, cough, choking, chest tightness and stridor.   Cardiovascular: Negative for palpitations.  Gastrointestinal: Negative for nausea, constipation, blood in stool and abdominal distention.  Genitourinary: Negative for dysuria, urgency, frequency, hematuria, flank pain, decreased urine volume and difficulty urinating.  Musculoskeletal: Negative for myalgias, back pain, joint swelling, arthralgias and gait problem.  Skin: Negative for color change and pallor.  Neurological: Negative for dizziness, tremors, seizures, syncope, speech difficulty, weakness, light-headedness and numbness.  Hematological: Negative for adenopathy. Bruises/bleeds easily.  Psychiatric/Behavioral: Negative for confusion, sleep disturbance and agitation. The patient is not nervous/anxious.        Objective:   Physical Exam  Filed Vitals:   03/26/13 1104  BP: 118/76  Pulse: 56  Temp: 97.9 F (36.6 C)  TempSrc: Oral  Height: 5\' 7"  (1.702 m)  Weight: 175 lb (79.379 kg)  SpO2: 90%    Gen: Pleasant, well-nourished, in no distress,  normal affect  ENT: No lesions,  mouth clear,  oropharynx clear, no postnasal drip  Neck: 1+  JVD, no TMG, no carotid bruits  Lungs: No use of accessory muscles, no dullness to percussion, clear without rales or rhonchi  Cardiovascular: RRR, split P2 otherwise  heart sounds normal, no murmur or gallops, +++ peripheral edema  Abdomen: soft and NT, no HSM,  BS normal  Musculoskeletal: No deformities, no cyanosis or clubbing  Neuro: alert, non focal  Skin: Warm, no lesions or rashes     Assessment & Plan:   Pulmonary hypertension Severe pulm HTN , failed  rx of OSA alone  Plan A flu vaccine was given Start the new medication, once daily Opsumit 10mg        Updated Medication List Outpatient Encounter Prescriptions as of 03/26/2013  Medication Sig Dispense Refill  . allopurinol (ZYLOPRIM) 300 MG tablet TAKE ONE (1) TABLET EACH DAY  30 tablet  3  . amLODipine (NORVASC) 5 MG tablet Take 1 tablet (5 mg total) by mouth daily.  30 tablet  3  . aspirin 81 MG tablet Take 81 mg by mouth daily.      Marland Kitchen atorvastatin (LIPITOR) 40 MG tablet Take 40 mg by mouth daily.      . digoxin (LANOXIN) 0.25 MG tablet Take 1 tablet (250 mcg total) by mouth daily.  90 tablet  1  . Flaxseed, Linseed, (FLAXSEED OIL) 1000 MG CAPS 1 cap twice a day    0  . furosemide (LASIX) 40 MG tablet Take 1 tablet (40 mg total) by mouth daily.  30 tablet  5  . isosorbide dinitrate (ISORDIL) 30 MG tablet Take 30 mg by mouth daily.      . metoprolol succinate (TOPROL-XL) 100 MG 24 hr tablet Take 1 tablet (100 mg total) by mouth daily. Take with or immediately following a meal.  30 tablet  5  . naproxen sodium (ANAPROX) 220 MG tablet Take 220 mg by mouth as needed.      . niacin (NIASPAN) 1000 MG CR tablet Take 1,000 mg by mouth at bedtime.      . potassium chloride SA (K-DUR,KLOR-CON) 20 MEQ tablet Take 1 tablet (20 mEq total) by mouth daily.  30 tablet  5  . warfarin (COUMADIN) 5 MG tablet 7.5 mg on Sun, Weds 5 mg on Sat, Mon, Tues, Thurs, Fri As directed      . [DISCONTINUED] warfarin (COUMADIN) 5 MG tablet 5 mg on Sun, Weds 7.5 mg on  Sat, Mon, Tues, Thurs, Fri As directed  120 tablet  1  . Macitentan (OPSUMIT) 10 MG TABS Take 10 mg by mouth daily.  30 tablet  6   No facility-administered encounter medications on file as of 03/26/2013.

## 2013-03-26 NOTE — Telephone Encounter (Signed)
Pt was in the office today.  He has received the Opsumit.   Called [employer] at [phone] as Glynis Smiles is off,  spoke with Camille Bal.  Advised of above.  Per Camille Bal, they sent pt a free 30 day shipment of the med to get him started while we were in the process of getting this approved through the insurance. Pt's refills are pending on this approval.  Per Camille Bal, they will still need to approval/denial letter faxed, and we can call [phone].   Called above # per [place], spoke with Carollee Herter to check on the status of this.  Was advised pt's plan, [facility], covers the Opsumit with no Prior Auth, and if [facility] is needing anything further, they can call the # provided above.   Called [employer] back, spoke with [place].  Informed her of above per [facility].  She verbalized understanding of this and states nothing further is needed from our end.  Will sign off.

## 2013-03-31 ENCOUNTER — Other Ambulatory Visit: Payer: Self-pay | Admitting: Internal Medicine

## 2013-04-02 ENCOUNTER — Encounter: Payer: Self-pay | Admitting: Critical Care Medicine

## 2013-04-02 NOTE — Telephone Encounter (Signed)
Rx request to pharmacy/[facility]  

## 2013-04-09 ENCOUNTER — Telehealth: Payer: Self-pay | Admitting: Critical Care Medicine

## 2013-04-09 NOTE — Telephone Encounter (Signed)
The headache is the only major effect i associate, nevertheless: STOP the medication and see if the symptoms resolve and call us back with a report in a week

## 2013-04-09 NOTE — Telephone Encounter (Signed)
I spoke with pt. He reports from the opsumit he believes he is having side effects from this. He c/o low grade fever, chills/sweats at times. HA at times, increase in itching like a rash would on his back mainly (not to that it bothers him very much). Pt reports this has been going on since towards the last week of september. Pt is wanting to know if he needs to take anything for these symptoms. Please advise Dr. Delford Field thanks  No Known Allergies

## 2013-04-09 NOTE — Telephone Encounter (Signed)
I called and spoke with pt and made aware of recs. Nothing further needed 

## 2013-04-23 ENCOUNTER — Ambulatory Visit (HOSPITAL_BASED_OUTPATIENT_CLINIC_OR_DEPARTMENT_OTHER)
Admission: RE | Admit: 2013-04-23 | Discharge: 2013-04-23 | Disposition: A | Discharge status: Home / Self Care | Payer: Medicare Other | Source: Ambulatory Visit | Attending: Critical Care Medicine | Admitting: Critical Care Medicine

## 2013-04-23 ENCOUNTER — Telehealth: Payer: Self-pay | Admitting: Critical Care Medicine

## 2013-04-23 ENCOUNTER — Encounter: Payer: Self-pay | Admitting: Critical Care Medicine

## 2013-04-23 ENCOUNTER — Ambulatory Visit (INDEPENDENT_AMBULATORY_CARE_PROVIDER_SITE_OTHER): Payer: Medicare Other | Admitting: Critical Care Medicine

## 2013-04-23 VITALS — BP 120/60 | HR 82 | Temp 98.2°F | Ht 67.0 in | Wt 174.0 lb

## 2013-04-23 DIAGNOSIS — I272 Pulmonary hypertension, unspecified: Secondary | ICD-10-CM

## 2013-04-23 DIAGNOSIS — I700 Atherosclerosis of aorta: Secondary | ICD-10-CM | POA: Insufficient documentation

## 2013-04-23 DIAGNOSIS — I2789 Other specified pulmonary heart diseases: Secondary | ICD-10-CM | POA: Insufficient documentation

## 2013-04-23 DIAGNOSIS — J9611 Chronic respiratory failure with hypoxia: Secondary | ICD-10-CM

## 2013-04-23 DIAGNOSIS — J84112 Idiopathic pulmonary fibrosis: Secondary | ICD-10-CM | POA: Insufficient documentation

## 2013-04-23 DIAGNOSIS — J8410 Pulmonary fibrosis, unspecified: Secondary | ICD-10-CM

## 2013-04-23 DIAGNOSIS — G4733 Obstructive sleep apnea (adult) (pediatric): Secondary | ICD-10-CM

## 2013-04-23 DIAGNOSIS — T50905S Adverse effect of unspecified drugs, medicaments and biological substances, sequela: Secondary | ICD-10-CM

## 2013-04-23 LAB — CBC WITH DIFFERENTIAL/PLATELET
Eosinophils Absolute: 0.2 10*3/uL (ref 0.0–0.7)
Eosinophils Relative: 3 % (ref 0–5)
Hemoglobin: 14.1 g/dL (ref 13.0–17.0)
Lymphs Abs: 0.5 10*3/uL — ABNORMAL LOW (ref 0.7–4.0)
MCH: 27.3 pg (ref 26.0–34.0)
MCV: 83.9 fL (ref 78.0–100.0)
Monocytes Relative: 15 % — ABNORMAL HIGH (ref 3–12)
RBC: 5.16 MIL/uL (ref 4.22–5.81)

## 2013-04-23 LAB — COMPREHENSIVE METABOLIC PANEL
Alkaline Phosphatase: 56 U/L (ref 39–117)
BUN: 36 mg/dL — ABNORMAL HIGH (ref 6–23)
Glucose, Bld: 95 mg/dL (ref 70–99)
Sodium: 140 mEq/L (ref 135–145)
Total Bilirubin: 1.4 mg/dL — ABNORMAL HIGH (ref 0.3–1.2)

## 2013-04-23 MED ORDER — CEFUROXIME AXETIL 500 MG PO TABS: 500.0000 mg | ORAL_TABLET | Freq: Two times a day (BID) | ORAL | Status: DC

## 2013-04-23 MED ORDER — PREDNISONE 10 MG PO TABS: ORAL_TABLET | ORAL | Status: DC

## 2013-04-23 NOTE — Patient Instructions (Addendum)
Stop Amlodipine Stop Niacin HOLD Opsumit  Get oxygen 2.5-3Liter rest day time.  3Liter pulse with portable concentrator exertion.  3Liter with cpap at bedtime Labs today Chest xray today Start prednisone 10mg  Take 4 for four days 3 for four days 2 for four days 1 for four days Start Ceftin 500 twice daily for 7days Return 2 weeks

## 2013-04-23 NOTE — Assessment & Plan Note (Signed)
Prior CXR/CT Chest 11/2011 suggested pulm edema CXR 04/23/2013: more in keeping with acute pneumonitis and pulm fibrosis flare In retrospect, evern thought DLCO moderately reduced and TLC normal, it appears that pulm process may be due to acute on chronic IPF flare Plan Pulse prednisone Will ask patient to HOLD Opsumit for now Will ask pt to HOLD niacin and amlodipine Will need another HRCT Needs to wear oxygen more often Will also order Azithromyin 250 mg Take two once then one daily until gone Return for short term f/u

## 2013-04-23 NOTE — Telephone Encounter (Signed)
lmomtcb x1 for Jordan Bautista

## 2013-04-23 NOTE — Progress Notes (Addendum)
Subjective:    Patient ID: Jordan Bautista, male    DOB: July 18, 1928, 79 y.o.   MRN: [id number]  HPI  04/23/2013 Chief Complaint  Patient presents with  . Follow-up    1 mth f/u -PT oxygen sats 77% RA upon arrival to [place] -  Increased sob and fatigue since last ov - SOB walking room to room - Cough - occas prod (brown) - Denies chest tightness - OCcas wheezing  Opsimut>>5 days later H/A, sweats, notes a slight rash and ? Flu? Notes  Flu like illness, loose stools, No H/A.  Has rash and itches. Notes a rash with Bp.    Notes a cough productive brown mucus.  No blood seen.  No chest pain. Notes some lightheadedness.  Pt needs more oxygen  Past Medical History  Diagnosis Date  . Atrial fibrillation   . High cholesterol   . Hypertension   . Skin cancer   . Heart failure   . Gout 08/20/2012  . Dehydration 02/06/2013     Family History  Problem Relation Age of Onset  . Prostate cancer Neg Hx   . Colon cancer Neg Hx   . Breast cancer Mother   . Hypertension Neg Hx   . Heart disease Neg Hx   . Diabetes Son     2 sons     History   Social History  . Marital Status: Married    Spouse Name: N/A    Number of Children: N/A  . Years of Education: N/A   Occupational History  . retired     Set designer    Social History Main Topics  . Smoking status: Former Smoker -- 1.00 packs/day for 30 years    Types: Cigarettes, Pipe, Cigars    Quit date: 06/28/1978  . Smokeless tobacco: Never Used  . Alcohol Use: Yes  . Drug Use: No  . Sexual Activity: Not on file   Other Topics Concern  . Not on file   Social History Narrative  . No narrative on file     No Known Allergies   Outpatient Prescriptions Prior to Visit  Medication Sig Dispense Refill  . allopurinol (ZYLOPRIM) 300 MG tablet TAKE ONE (1) TABLET EACH DAY  30 tablet  3  . aspirin 81 MG tablet Take 81 mg by mouth daily.      Marland Kitchen atorvastatin (LIPITOR) 40 MG tablet Take 20 mg by mouth daily.       . digoxin (LANOXIN)  0.25 MG tablet Take 1 tablet (250 mcg total) by mouth daily.  90 tablet  1  . Flaxseed, Linseed, (FLAXSEED OIL) 1000 MG CAPS 1 cap twice a day    0  . furosemide (LASIX) 40 MG tablet Take 1 tablet (40 mg total) by mouth daily.  30 tablet  5  . isosorbide dinitrate (ISORDIL) 30 MG tablet Take 30 mg by mouth daily.      . Macitentan (OPSUMIT) 10 MG TABS Take 10 mg by mouth daily.  30 tablet  6  . metoprolol succinate (TOPROL-XL) 100 MG 24 hr tablet Take 1 tablet (100 mg total) by mouth daily. Take with or immediately following a meal.  30 tablet  5  . potassium chloride SA (K-DUR,KLOR-CON) 20 MEQ tablet Take 1 tablet (20 mEq total) by mouth daily.  30 tablet  5  . warfarin (COUMADIN) 5 MG tablet 7.5 mg on Sun, Weds 5 mg on Sat, Mon, Tues, Thurs, Fri As directed      . amLODipine (  NORVASC) 5 MG tablet TAKE ONE (1) TABLET EACH DAY  30 tablet  3  . niacin (NIASPAN) 1000 MG CR tablet Take 1,000 mg by mouth at bedtime.      . naproxen sodium (ANAPROX) 220 MG tablet Take 220 mg by mouth as needed.       No facility-administered medications prior to visit.      Review of Systems  Constitutional: Positive for chills. Negative for diaphoresis, activity change, appetite change, fatigue and unexpected weight change.  HENT: Positive for tinnitus. Negative for congestion, dental problem, ear discharge, facial swelling, hearing loss, mouth sores, nosebleeds, postnasal drip, sinus pressure, sneezing, trouble swallowing and voice change.   Eyes: Negative for photophobia, discharge, itching and visual disturbance.  Respiratory: Positive for cough and shortness of breath. Negative for apnea, choking, chest tightness and stridor.   Cardiovascular: Negative for palpitations.  Gastrointestinal: Negative for nausea, constipation, blood in stool and abdominal distention.  Genitourinary: Negative for dysuria, urgency, frequency, hematuria, flank pain, decreased urine volume and difficulty urinating.   Musculoskeletal: Negative for arthralgias, back pain, gait problem, joint swelling, myalgias and neck stiffness.  Skin: Positive for rash. Negative for color change and pallor.  Neurological: Positive for dizziness and headaches. Negative for tremors, seizures, syncope, speech difficulty, weakness, light-headedness and numbness.  Hematological: Negative for adenopathy. Bruises/bleeds easily.  Psychiatric/Behavioral: Negative for confusion, sleep disturbance and agitation. The patient is not nervous/anxious.        Objective:   Physical Exam  Filed Vitals:   04/23/13 1054 04/23/13 1059 04/23/13 1104  BP: 120/60    Pulse: 82    Temp: 98.2 F (36.8 C)    TempSrc: Oral    Height: 5\' 7"  (1.702 m)    Weight: 174 lb (78.926 kg)    SpO2: 77% 83% 94%    Gen: Pleasant, well-nourished, in no distress,  normal affect  ENT: No lesions,  mouth clear,  oropharynx clear, no postnasal drip  Neck: 1+  JVD, no TMG, no carotid bruits  Lungs: No use of accessory muscles, no dullness to percussion, clear without rales or rhonchi  Cardiovascular: RRR, split P2 otherwise  heart sounds normal, no murmur or gallops, ++ peripheral edema  Abdomen: soft and NT, no HSM,  BS normal  Musculoskeletal: No deformities, no cyanosis or clubbing  Neuro: alert, non focal  Skin: Warm, no lesions or rashes  Pt needs: portable concentrator 3L pulse exertion. 2.5-3L rest Maintain cpap 13cmh20 plus 3L QHS desats to 80% on 2L exertion, stays > 90% with 3L continuous  Dg Chest 2 View  04/23/2013   CLINICAL DATA:  Pulmonary hypertension.  EXAM: CHEST  2 VIEW  COMPARISON:  11/30/2011. CT scan [facility] 12/02/2011.  FINDINGS: There is little interval change in the chest compared to the prior exam. The nodular density at the medial right lung base appears more diffuse on today's exam and No discrete nodule can be identified. There is no pleural effusion. Diffuse pulmonary fibrosis is present.  Mediastinal contours appear unchanged. Aortic arch atherosclerosis. Small area of airspace disease is present near the right costophrenic angle seen on the frontal view. This may represent more acute phase interstitial pneumonitis or infection. There is enlargement of the pulmonary arteries on the lateral view, compatible with pulmonary arterial hypertension.  IMPRESSION: Overall findings most compatible with interstitial lung disease with pulmonary fibrosis with pulmonary arterial hypertension. Scattered alveolar densities may represent superimposed acute infection or acute phase interstitial disease. No convincing evidence of failure.  Electronically Signed   By: Andreas Newport M.D.   On: 04/23/2013 13:28      Assessment & Plan:   Pulmonary hypertension Severe pulmonary hypertension with associated obstructive sleep apnea and mild diastolic heart failure but no other source or cause for pulmonary hypertension discerned Mild side effects from OpSumit (macitentan) likely exacerbated by concomitant niacin and amlodipine use Plan Stop Amlodipine Stop Niacin Stop Opsumit  Get oxygen 2.5-3Liter rest day time.  3Liter pulse with portable concentrator exertion.  3Liter with cpap at bedtime Obtain liver function profile and CBC and chest X.ray  Postinflammatory pulmonary fibrosis Prior CXR/CT Chest 11/2011 suggested pulm edema CXR 04/23/2013: more in keeping with acute pneumonitis and pulm fibrosis flare In retrospect, evern thought DLCO moderately reduced and TLC normal, it appears that pulm process may be due to acute on chronic IPF flare Plan Pulse prednisone Will ask patient to HOLD Opsumit for now Will ask pt to HOLD niacin and amlodipine Will need another HRCT Needs to wear oxygen more often Will also order Azithromyin 250 mg Take two once then one daily until gone Return for short term f/u     Updated Medication List Outpatient Encounter Prescriptions as of 04/23/2013  Medication  Sig Dispense Refill  . allopurinol (ZYLOPRIM) 300 MG tablet TAKE ONE (1) TABLET EACH DAY  30 tablet  3  . aspirin 81 MG tablet Take 81 mg by mouth daily.      Marland Kitchen atorvastatin (LIPITOR) 40 MG tablet Take 20 mg by mouth daily.       . digoxin (LANOXIN) 0.25 MG tablet Take 1 tablet (250 mcg total) by mouth daily.  90 tablet  1  . Flaxseed, Linseed, (FLAXSEED OIL) 1000 MG CAPS 1 cap twice a day    0  . furosemide (LASIX) 40 MG tablet Take 1 tablet (40 mg total) by mouth daily.  30 tablet  5  . isosorbide dinitrate (ISORDIL) 30 MG tablet Take 30 mg by mouth daily.      . Macitentan (OPSUMIT) 10 MG TABS Take 10 mg by mouth daily.  30 tablet  6  . metoprolol succinate (TOPROL-XL) 100 MG 24 hr tablet Take 1 tablet (100 mg total) by mouth daily. Take with or immediately following a meal.  30 tablet  5  . potassium chloride SA (K-DUR,KLOR-CON) 20 MEQ tablet Take 1 tablet (20 mEq total) by mouth daily.  30 tablet  5  . warfarin (COUMADIN) 5 MG tablet 7.5 mg on Sun, Weds 5 mg on Sat, Mon, Tues, Thurs, Fri As directed      . [DISCONTINUED] amLODipine (NORVASC) 5 MG tablet TAKE ONE (1) TABLET EACH DAY  30 tablet  3  . [DISCONTINUED] niacin (NIASPAN) 1000 MG CR tablet Take 1,000 mg by mouth at bedtime.      . cefUROXime (CEFTIN) 500 MG tablet Take 1 tablet (500 mg total) by mouth 2 (two) times daily.  14 tablet  0  . predniSONE (DELTASONE) 10 MG tablet Take 4 for four days 3 for four days 2 for four days 1 for four days  40 tablet  0  . [DISCONTINUED] naproxen sodium (ANAPROX) 220 MG tablet Take 220 mg by mouth as needed.       No facility-administered encounter medications on file as of 04/23/2013.

## 2013-04-23 NOTE — Addendum Note (Signed)
Addended by: Storm Frisk on: 04/23/2013 05:28 PM   Modules accepted: Orders

## 2013-04-23 NOTE — Assessment & Plan Note (Signed)
Severe pulmonary hypertension with associated obstructive sleep apnea and mild diastolic heart failure but no other source or cause for pulmonary hypertension discerned Mild side effects from OpSumit (macitentan) likely exacerbated by concomitant niacin and amlodipine use Plan Stop Amlodipine Stop Niacin Stay on Opsumit daily Get oxygen 2.5-3Liter rest day time.  3Liter pulse with portable concentrator exertion.  3Liter with cpap at bedtime Obtain liver function profile and CBC and chest X.ray

## 2013-04-23 NOTE — Addendum Note (Signed)
Addended by: Shan Levans E on: 04/23/2013 03:30 PM   Modules accepted: Orders

## 2013-04-24 ENCOUNTER — Telehealth: Payer: Self-pay | Admitting: Critical Care Medicine

## 2013-04-24 NOTE — Telephone Encounter (Signed)
[id number]. Lashya Passe, CMA  

## 2013-04-24 NOTE — Telephone Encounter (Signed)
I spoke with the pt wife and she states [facility] advised them that they cannot take this pt on as a new pt at this time. She is needing an order sent to a DME that offers a POC that is run by batteries. I have placed a new order with specifics and asked [facility] to send to any company that can provide the pt with what he needs. Pt wife wants to be advised what company it is sent to . I will have [facility] advise her. Wife works in Armed forces operational [employer], and they live in [place], so ok with either location. Carron Curie, CMA

## 2013-04-24 NOTE — Telephone Encounter (Signed)
Pt's wife patricia returning call can be reached at [phone].Jordan Bautista

## 2013-04-24 NOTE — Telephone Encounter (Signed)
I spoke with pt. He states he has already spoke with Dr. Delford Field. Nothing further needed

## 2013-04-25 ENCOUNTER — Telehealth: Payer: Self-pay | Admitting: *Deleted

## 2013-04-25 ENCOUNTER — Other Ambulatory Visit: Payer: Self-pay | Admitting: Critical Care Medicine

## 2013-04-25 ENCOUNTER — Ambulatory Visit (HOSPITAL_BASED_OUTPATIENT_CLINIC_OR_DEPARTMENT_OTHER)
Admission: RE | Admit: 2013-04-25 | Discharge: 2013-04-25 | Disposition: A | Discharge status: Home / Self Care | Payer: Medicare Other | Source: Ambulatory Visit | Attending: Critical Care Medicine | Admitting: Critical Care Medicine

## 2013-04-25 DIAGNOSIS — R599 Enlarged lymph nodes, unspecified: Secondary | ICD-10-CM | POA: Insufficient documentation

## 2013-04-25 DIAGNOSIS — J8410 Pulmonary fibrosis, unspecified: Secondary | ICD-10-CM | POA: Insufficient documentation

## 2013-04-25 DIAGNOSIS — J479 Bronchiectasis, uncomplicated: Secondary | ICD-10-CM | POA: Insufficient documentation

## 2013-04-25 DIAGNOSIS — I700 Atherosclerosis of aorta: Secondary | ICD-10-CM | POA: Insufficient documentation

## 2013-04-25 DIAGNOSIS — Z87891 Personal history of nicotine dependence: Secondary | ICD-10-CM | POA: Insufficient documentation

## 2013-04-25 DIAGNOSIS — I2510 Atherosclerotic heart disease of native coronary artery without angina pectoris: Secondary | ICD-10-CM | POA: Insufficient documentation

## 2013-04-25 DIAGNOSIS — I517 Cardiomegaly: Secondary | ICD-10-CM | POA: Insufficient documentation

## 2013-04-25 NOTE — Telephone Encounter (Signed)
Pft has been scheduled at [facility] @ 10am 05/01/13.  Pt is aware .Kandice Hams

## 2013-04-25 NOTE — Telephone Encounter (Signed)
Message copied by Valentino Hue on Wed Apr 25, 2013 12:34 PM ------      Message from: Shan Levans E      Created: Tue Apr 24, 2013  2:19 PM       This pt needs sooner ov than 6 weeks      Needs rov 3 weeks ------

## 2013-04-25 NOTE — Telephone Encounter (Signed)
Called, spoke with pt.  We have scheduled him for a 3 wk follow up with PW in [place] on NOv 17 at 10:45 am.  Pt aware. Pt also needs a PFT scheduled prior to this appt.  Pt prefers to have it done at [facility].  Order is already in epic.  Alida, as we spoke about this earlier, will you please set this up and let pt know?  Thank you.

## 2013-04-27 NOTE — Progress Notes (Signed)
Quick Note:  Called, spoke with pt. Informed him of CT Chest results and recs per Dr. Delford Field. He verbalized understanding and is aware to keep pending OV with PW on Mon, Nov 17 in [place] at 10:45 am. ______

## 2013-05-01 LAB — PULMONARY FUNCTION TEST

## 2013-05-02 ENCOUNTER — Telehealth: Payer: Self-pay | Admitting: Critical Care Medicine

## 2013-05-02 NOTE — Telephone Encounter (Signed)
I spoke with spouse. She reports she never received in the mail what [employer] was sending to her. I advised I will have Synetta Fail from [place] give her a call.  I spoke with Synetta Fail and advised her of this. She will call pt. Nothing further needed

## 2013-05-08 ENCOUNTER — Encounter: Payer: Self-pay | Admitting: Family Medicine

## 2013-05-08 ENCOUNTER — Ambulatory Visit (INDEPENDENT_AMBULATORY_CARE_PROVIDER_SITE_OTHER): Payer: Medicare Other | Admitting: Family Medicine

## 2013-05-08 VITALS — BP 112/62 | HR 62 | Temp 97.5°F | Resp 14 | Ht 67.0 in | Wt 158.0 lb

## 2013-05-08 DIAGNOSIS — H7292 Unspecified perforation of tympanic membrane, left ear: Secondary | ICD-10-CM

## 2013-05-08 DIAGNOSIS — I10 Essential (primary) hypertension: Secondary | ICD-10-CM

## 2013-05-08 DIAGNOSIS — H7290 Unspecified perforation of tympanic membrane, unspecified ear: Secondary | ICD-10-CM

## 2013-05-08 DIAGNOSIS — E785 Hyperlipidemia, unspecified: Secondary | ICD-10-CM

## 2013-05-08 DIAGNOSIS — I272 Pulmonary hypertension, unspecified: Secondary | ICD-10-CM

## 2013-05-08 DIAGNOSIS — I2789 Other specified pulmonary heart diseases: Secondary | ICD-10-CM

## 2013-05-08 DIAGNOSIS — Z23 Encounter for immunization: Secondary | ICD-10-CM

## 2013-05-08 NOTE — Progress Notes (Signed)
Pre visit review using our clinic review tool, if applicable. No additional management support is needed unless otherwise documented below in the visit note/SLS  

## 2013-05-08 NOTE — Progress Notes (Signed)
Patient ID: Jordan Bautista, male   DOB: Mar 02, 1929, 79 y.o.   MRN: [id number] Jordan Bautista [id number] Aug 22, 1928 05/08/2013      Progress Note-Follow Up  Subjective  Chief Complaint  Chief Complaint  Patient presents with  . Follow-up    3-mth. [AFib, Renal Insufficiency, OSA, Pulmonary HTN]    HPI  Patient is an 79 year old in today for followup. Generally doing well. There is ill back in October with fevers shortness of breath and weakness but those symptoms have resolved. No chest pain or palpitations. Appetite does continue to be somewhat poor. No GI or GU complaints at this time. Taking medications as prescribed.  Past Medical History  Diagnosis Date  . Atrial fibrillation   . High cholesterol   . Hypertension   . Skin cancer   . Heart failure   . Gout 08/20/2012  . Dehydration 02/06/2013    Past Surgical History  Procedure Laterality Date  . Basal cell carcinoma excision  2013    Family History  Problem Relation Age of Onset  . Prostate cancer Neg Hx   . Colon cancer Neg Hx   . Breast cancer Mother   . Hypertension Neg Hx   . Heart disease Neg Hx   . Diabetes Son     2 sons    History   Social History  . Marital Status: Married    Spouse Name: N/A    Number of Children: N/A  . Years of Education: N/A   Occupational History  . retired     Set designer    Social History Main Topics  . Smoking status: Former Smoker -- 1.00 packs/day for 30 years    Types: Cigarettes, Pipe, Cigars    Quit date: 06/28/1978  . Smokeless tobacco: Never Used  . Alcohol Use: Yes  . Drug Use: No  . Sexual Activity: Not on file   Other Topics Concern  . Not on file   Social History Narrative  . No narrative on file    Current Outpatient Prescriptions on File Prior to Visit  Medication Sig Dispense Refill  . allopurinol (ZYLOPRIM) 300 MG tablet TAKE ONE (1) TABLET EACH DAY  30 tablet  3  . aspirin 81 MG tablet Take 81 mg by mouth daily.      Marland Kitchen atorvastatin  (LIPITOR) 40 MG tablet Take 20 mg by mouth daily.       . digoxin (LANOXIN) 0.25 MG tablet Take 1 tablet (250 mcg total) by mouth daily.  90 tablet  1  . Flaxseed, Linseed, (FLAXSEED OIL) 1000 MG CAPS 1 cap twice a day    0  . furosemide (LASIX) 40 MG tablet Take 1 tablet (40 mg total) by mouth daily.  30 tablet  5  . isosorbide dinitrate (ISORDIL) 30 MG tablet Take 30 mg by mouth daily.      . metoprolol succinate (TOPROL-XL) 100 MG 24 hr tablet Take 1 tablet (100 mg total) by mouth daily. Take with or immediately following a meal.  30 tablet  5  . potassium chloride SA (K-DUR,KLOR-CON) 20 MEQ tablet Take 1 tablet (20 mEq total) by mouth daily.  30 tablet  5  . warfarin (COUMADIN) 5 MG tablet 7.5 mg on Sun, Weds 5 mg on Sat, Mon, Tues, Thurs, Fri As directed       No current facility-administered medications on file prior to visit.    No Known Allergies  Review of Systems  Review of Systems  Constitutional: Negative for  fever and malaise/fatigue.  HENT: Negative for congestion.   Eyes: Negative for discharge.  Respiratory: Negative for shortness of breath.   Cardiovascular: Negative for chest pain, palpitations and leg swelling.  Gastrointestinal: Negative for nausea, abdominal pain and diarrhea.  Genitourinary: Negative for dysuria.  Musculoskeletal: Negative for falls.  Skin: Negative for rash.  Neurological: Negative for loss of consciousness and headaches.  Endo/Heme/Allergies: Negative for polydipsia.  Psychiatric/Behavioral: Negative for depression and suicidal ideas. The patient is not nervous/anxious and does not have insomnia.     Objective  BP 112/62  Pulse 62  Temp(Src) 97.5 F (36.4 C) (Oral)  Resp 14  Ht 5\' 7"  (1.702 m)  Wt 158 lb (71.668 kg)  BMI 24.74 kg/m2  SpO2 93%  Physical Exam  Physical Exam  Constitutional: He is oriented to person, place, and time and well-developed, well-nourished, and in no distress. No distress.  HENT:  Head: Normocephalic  and atraumatic.  Eyes: Conjunctivae are normal.  Neck: Neck supple. No thyromegaly present.  Cardiovascular: Normal rate, regular rhythm and normal heart sounds.   No murmur heard. Pulmonary/Chest: Effort normal and breath sounds normal. No respiratory distress.  Abdominal: He exhibits no distension and no mass. There is no tenderness.  Musculoskeletal: He exhibits no edema.  Neurological: He is alert and oriented to person, place, and time.  Skin: Skin is warm.  Psychiatric: Memory, affect and judgment normal.    Lab Results  Component Value Date   TSH 2.224 01/30/2013   Lab Results  Component Value Date   WBC 7.6 04/23/2013   HGB 14.1 04/23/2013   HCT 43.3 04/23/2013   MCV 83.9 04/23/2013   PLT 216 04/23/2013   Lab Results  Component Value Date   CREATININE 1.30 04/23/2013   BUN 36* 04/23/2013   NA 140 04/23/2013   K 5.2 04/23/2013   CL 99 04/23/2013   CO2 32 04/23/2013   Lab Results  Component Value Date   ALT 16 04/23/2013   AST 14 04/23/2013   ALKPHOS 56 04/23/2013   BILITOT 1.4* 04/23/2013   Lab Results  Component Value Date   CHOL 129 01/30/2013   Lab Results  Component Value Date   HDL 28* 01/30/2013   Lab Results  Component Value Date   LDLCALC 77 01/30/2013   Lab Results  Component Value Date   TRIG 119 01/30/2013   Lab Results  Component Value Date   CHOLHDL 4.6 01/30/2013     Assessment & Plan  HTN (hypertension) Metoprolol   Pulmonary hypertension Has been seen by pulmonology recently, no significant changes. Doing well  Hyperlipidemia Tolerating Lipitor, add krill oil

## 2013-05-08 NOTE — Patient Instructions (Signed)
Add protein via drinks or powder to each meal

## 2013-05-13 ENCOUNTER — Encounter: Payer: Self-pay | Admitting: Family Medicine

## 2013-05-13 DIAGNOSIS — I10 Essential (primary) hypertension: Secondary | ICD-10-CM

## 2013-05-13 HISTORY — DX: Essential (primary) hypertension: I10

## 2013-05-13 NOTE — Assessment & Plan Note (Signed)
Tolerating Lipitor, add krill oil

## 2013-05-13 NOTE — Assessment & Plan Note (Signed)
Has been seen by pulmonology recently, no significant changes. Doing well

## 2013-05-13 NOTE — Assessment & Plan Note (Signed)
Metoprolol 

## 2013-05-14 ENCOUNTER — Encounter: Payer: Self-pay | Admitting: Critical Care Medicine

## 2013-05-14 ENCOUNTER — Ambulatory Visit (INDEPENDENT_AMBULATORY_CARE_PROVIDER_SITE_OTHER): Payer: Medicare Other | Admitting: Critical Care Medicine

## 2013-05-14 VITALS — BP 110/60 | HR 71 | Temp 97.5°F | Ht 67.0 in | Wt 158.0 lb

## 2013-05-14 DIAGNOSIS — J8410 Pulmonary fibrosis, unspecified: Secondary | ICD-10-CM

## 2013-05-14 DIAGNOSIS — I2789 Other specified pulmonary heart diseases: Secondary | ICD-10-CM

## 2013-05-14 DIAGNOSIS — I272 Pulmonary hypertension, unspecified: Secondary | ICD-10-CM

## 2013-05-14 MED ORDER — PIRFENIDONE 267 MG PO CAPS: ORAL_CAPSULE | ORAL | Status: DC

## 2013-05-14 NOTE — Assessment & Plan Note (Addendum)
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

## 2013-05-14 NOTE — Patient Instructions (Signed)
We will obtain pirfenidone  Stop Opsumit Stay off niacin and amlodipine Return 6 weeks A portable oxygen system will be obtained

## 2013-05-14 NOTE — Progress Notes (Signed)
Subjective:    Patient ID: Jordan Bautista, male    DOB: 1928-07-25, 79 y.o.   MRN: [id number]  HPI  05/14/2013 Chief Complaint  Patient presents with  . Follow-up    2 week f/u - Cough is much better in past week - SOB with exertion a little less - Denies chest tightness or wheezing  Since the last visit the patient's cough and dyspnea have improved on prednisone therapy.  The pt is now off Opsumit. The patient denies chest pain. There is no excess congestion or wheezing. There is no excess mucus at this time or cough.   Past Medical History  Diagnosis Date  . Atrial fibrillation   . High cholesterol   . Hypertension   . Skin cancer   . Heart failure   . Gout 08/20/2012  . Dehydration 02/06/2013  . HTN (hypertension) 05/13/2013     Family History  Problem Relation Age of Onset  . Prostate cancer Neg Hx   . Colon cancer Neg Hx   . Breast cancer Mother   . Hypertension Neg Hx   . Heart disease Neg Hx   . Diabetes Son     2 sons     History   Social History  . Marital Status: Married    Spouse Name: N/A    Number of Children: N/A  . Years of Education: N/A   Occupational History  . retired     Set designer    Social History Main Topics  . Smoking status: Former Smoker -- 1.00 packs/day for 30 years    Types: Cigarettes, Pipe, Cigars    Quit date: 06/28/1978  . Smokeless tobacco: Never Used  . Alcohol Use: Yes  . Drug Use: No  . Sexual Activity: Not on file   Other Topics Concern  . Not on file   Social History Narrative  . No narrative on file     No Known Allergies   Outpatient Prescriptions Prior to Visit  Medication Sig Dispense Refill  . allopurinol (ZYLOPRIM) 300 MG tablet TAKE ONE (1) TABLET EACH DAY  30 tablet  3  . aspirin 81 MG tablet Take 81 mg by mouth daily.      Marland Kitchen atorvastatin (LIPITOR) 40 MG tablet Take 20 mg by mouth daily.       . digoxin (LANOXIN) 0.25 MG tablet Take 1 tablet (250 mcg total) by mouth daily.  90 tablet  1  .  Flaxseed, Linseed, (FLAXSEED OIL) 1000 MG CAPS 1 cap twice a day    0  . furosemide (LASIX) 40 MG tablet Take 1 tablet (40 mg total) by mouth daily.  30 tablet  5  . isosorbide dinitrate (ISORDIL) 30 MG tablet Take 30 mg by mouth daily.      . metoprolol succinate (TOPROL-XL) 100 MG 24 hr tablet Take 1 tablet (100 mg total) by mouth daily. Take with or immediately following a meal.  30 tablet  5  . potassium chloride SA (K-DUR,KLOR-CON) 20 MEQ tablet Take 1 tablet (20 mEq total) by mouth daily.  30 tablet  5  . warfarin (COUMADIN) 5 MG tablet 7.5 mg on Sun, Weds 5 mg on Sat, Mon, Tues, Thurs, Fri As directed       No facility-administered medications prior to visit.      Review of Systems  Constitutional: Positive for chills. Negative for diaphoresis, activity change, appetite change, fatigue and unexpected weight change.  HENT: Positive for tinnitus. Negative for congestion, dental problem, ear  discharge, facial swelling, hearing loss, mouth sores, nosebleeds, postnasal drip, sinus pressure, sneezing, trouble swallowing and voice change.   Eyes: Negative for photophobia, discharge, itching and visual disturbance.  Respiratory: Positive for cough and shortness of breath. Negative for apnea, choking, chest tightness and stridor.   Cardiovascular: Negative for palpitations.  Gastrointestinal: Negative for nausea, constipation, blood in stool and abdominal distention.  Genitourinary: Negative for dysuria, urgency, frequency, hematuria, flank pain, decreased urine volume and difficulty urinating.  Musculoskeletal: Negative for arthralgias, back pain, gait problem, joint swelling, myalgias and neck stiffness.  Skin: Positive for rash. Negative for color change and pallor.  Neurological: Positive for dizziness and headaches. Negative for tremors, seizures, syncope, speech difficulty, weakness, light-headedness and numbness.  Hematological: Negative for adenopathy. Bruises/bleeds easily.   Psychiatric/Behavioral: Negative for confusion, sleep disturbance and agitation. The patient is not nervous/anxious.        Objective:   Physical Exam  Filed Vitals:   05/14/13 1100  BP: 110/60  Pulse: 71  Temp: 97.5 F (36.4 C)  TempSrc: Oral  Height: 5\' 7"  (1.702 m)  Weight: 158 lb (71.668 kg)  SpO2: 91%    Gen: Pleasant, well-nourished, in no distress,  normal affect  ENT: No lesions,  mouth clear,  oropharynx clear, no postnasal drip  Neck: 1+  JVD, no TMG, no carotid bruits  Lungs: No use of accessory muscles, no dullness to percussion, bibasilar dry rales  Cardiovascular: RRR, split P2 otherwise  heart sounds normal, no murmur or gallops, ++ peripheral edema  Abdomen: soft and NT, no HSM,  BS normal  Musculoskeletal: No deformities, no cyanosis or clubbing  Neuro: alert, non focal  Skin: Warm, no lesions or rashes  CT scan the chest and recent pulmonary function studies were reviewed  No results found.    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 allopurinol (ZYLOPRIM) 300 MG tablet TAKE ONE (1) TABLET EACH DAY  . aspirin 81 MG tablet Take 81 mg by mouth daily.  Marland Kitchen atorvastatin (LIPITOR) 40 MG tablet Take 20 mg by mouth daily.   . digoxin (LANOXIN) 0.25 MG tablet Take 1 tablet (250 mcg total) by mouth daily.  . Flaxseed, Linseed, (FLAXSEED OIL) 1000 MG CAPS 1 cap twice a day  . furosemide (LASIX) 40 MG tablet Take 1 tablet (40 mg total) by mouth daily.  . isosorbide  dinitrate (ISORDIL) 30 MG tablet Take 30 mg by mouth daily.  . metoprolol succinate (TOPROL-XL) 100 MG 24 hr tablet Take 1 tablet (100 mg total) by mouth daily. Take with or immediately following a meal.  . potassium chloride SA (K-DUR,KLOR-CON) 20 MEQ tablet Take 1 tablet (20 mEq total) by mouth daily.  Marland Kitchen warfarin (COUMADIN) 5 MG tablet 7.5 mg on Sun, Weds 5 mg on Sat, Mon, Tues, Thurs, Fri As directed  . PIRFENIDONE 267 MG PO CAPS Take per protocol

## 2013-05-15 ENCOUNTER — Encounter: Payer: Self-pay | Admitting: Critical Care Medicine

## 2013-05-15 ENCOUNTER — Telehealth: Payer: Self-pay | Admitting: Critical Care Medicine

## 2013-05-15 NOTE — Telephone Encounter (Signed)
Filled out forms for new PF meds and pt is aware they will be faxed today Tobe Sos

## 2013-05-29 ENCOUNTER — Ambulatory Visit: Payer: BC Managed Care – HMO | Admitting: Critical Care Medicine

## 2013-05-31 ENCOUNTER — Other Ambulatory Visit: Payer: Self-pay

## 2013-05-31 MED ORDER — DIGOXIN 250 MCG PO TABS: 250.0000 ug | ORAL_TABLET | Freq: Every day | ORAL | Status: DC

## 2013-06-26 ENCOUNTER — Ambulatory Visit (INDEPENDENT_AMBULATORY_CARE_PROVIDER_SITE_OTHER): Payer: Medicare Other | Admitting: Critical Care Medicine

## 2013-06-26 ENCOUNTER — Encounter: Payer: Self-pay | Admitting: Critical Care Medicine

## 2013-06-26 VITALS — BP 116/68 | HR 74 | Temp 97.5°F | Ht 67.0 in | Wt 158.0 lb

## 2013-06-26 DIAGNOSIS — J8410 Pulmonary fibrosis, unspecified: Secondary | ICD-10-CM

## 2013-06-26 NOTE — Assessment & Plan Note (Signed)
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

## 2013-06-26 NOTE — Patient Instructions (Signed)
Use 4Liters oxygen with cpap at night No other medication changes We are checking on portable oxygen and esbriet status Return 6 weeks

## 2013-06-26 NOTE — Progress Notes (Signed)
Subjective:    Patient ID: Jordan Bautista, male    DOB: 1928/12/16, 79 y.o.   MRN: [id number]  HPI  06/26/2013 Chief Complaint  Patient presents with  . 6 wk follow up    o2 sat 71% upon arrival to exam room.  Using o2 more frequently.  Reports o2 sats are dropping into the high 80s at rest on RA and lower 80s with activity.  Has cough - mostly in the mornings with very little mucus.  No wheezing, chest tightness, or chest pain.  Needs cateract clearance.   Oxygen falling lower than before.  No real chest pain. Some mucus in the AM  Mucus is white.   Pt denies any significant sore throat, nasal congestion or excess secretions, fever, chills, sweats, unintended weight loss, pleurtic or exertional chest pain, orthopnea PND, or leg swelling Pt denies any increase in rescue therapy over baseline, denies waking up needing it or having any early am or nocturnal exacerbations of coughing/wheezing/or dyspnea. Pt also denies any obvious fluctuation in symptoms with  weather or environmental change or other alleviating or aggravating factors   Past Medical History  Diagnosis Date  . Atrial fibrillation   . High cholesterol   . Hypertension   . Skin cancer   . Heart failure   . Gout 08/20/2012  . Dehydration 02/06/2013  . HTN (hypertension) 05/13/2013     Family History  Problem Relation Age of Onset  . Prostate cancer Neg Hx   . Colon cancer Neg Hx   . Breast cancer Mother   . Hypertension Neg Hx   . Heart disease Neg Hx   . Diabetes Son     2 sons     History   Social History  . Marital Status: Married    Spouse Name: N/A    Number of Children: N/A  . Years of Education: N/A   Occupational History  . retired     Set designer    Social History Main Topics  . Smoking status: Former Smoker -- 1.00 packs/day for 30 years    Types: Cigarettes, Pipe, Cigars    Quit date: 06/28/1978  . Smokeless tobacco: Never Used  . Alcohol Use: Yes  . Drug Use: No  . Sexual Activity:  Not on file   Other Topics Concern  . Not on file   Social History Narrative  . No narrative on file     No Known Allergies   Outpatient Prescriptions Prior to Visit  Medication Sig Dispense Refill  . allopurinol (ZYLOPRIM) 300 MG tablet TAKE ONE (1) TABLET EACH DAY  30 tablet  3  . aspirin 81 MG tablet Take 81 mg by mouth daily.      Marland Kitchen atorvastatin (LIPITOR) 40 MG tablet Take 20 mg by mouth daily.       . digoxin (LANOXIN) 0.25 MG tablet Take 1 tablet (250 mcg total) by mouth daily.  90 tablet  1  . Flaxseed, Linseed, (FLAXSEED OIL) 1000 MG CAPS 1 cap twice a day    0  . furosemide (LASIX) 40 MG tablet Take 1 tablet (40 mg total) by mouth daily.  30 tablet  5  . isosorbide dinitrate (ISORDIL) 30 MG tablet Take 30 mg by mouth daily.      . metoprolol succinate (TOPROL-XL) 100 MG 24 hr tablet Take 1 tablet (100 mg total) by mouth daily. Take with or immediately following a meal.  30 tablet  5  . potassium chloride SA (K-DUR,KLOR-CON) 20  MEQ tablet Take 1 tablet (20 mEq total) by mouth daily.  30 tablet  5  . warfarin (COUMADIN) 5 MG tablet 7.5 mg on Sun, Weds 5 mg on Sat, Mon, Tues, Thurs, Fri As directed      . PIRFENIDONE 267 MG PO CAPS Take per protocol  60 tablet  6   No facility-administered medications prior to visit.      Review of Systems  Constitutional: Positive for chills. Negative for diaphoresis, activity change, appetite change, fatigue and unexpected weight change.  HENT: Positive for tinnitus. Negative for congestion, dental problem, ear discharge, facial swelling, hearing loss, mouth sores, nosebleeds, postnasal drip, sinus pressure, sneezing, trouble swallowing and voice change.   Eyes: Negative for photophobia, discharge, itching and visual disturbance.  Respiratory: Positive for cough and shortness of breath. Negative for apnea, choking, chest tightness and stridor.   Cardiovascular: Negative for palpitations.  Gastrointestinal: Negative for nausea,  constipation, blood in stool and abdominal distention.  Genitourinary: Negative for dysuria, urgency, frequency, hematuria, flank pain, decreased urine volume and difficulty urinating.  Musculoskeletal: Negative for arthralgias, back pain, gait problem, joint swelling, myalgias and neck stiffness.  Skin: Positive for rash. Negative for color change and pallor.  Neurological: Positive for dizziness and headaches. Negative for tremors, seizures, syncope, speech difficulty, weakness, light-headedness and numbness.  Hematological: Negative for adenopathy. Bruises/bleeds easily.  Psychiatric/Behavioral: Negative for confusion, sleep disturbance and agitation. The patient is not nervous/anxious.        Objective:   Physical Exam  Filed Vitals:   06/26/13 1438 06/26/13 1440  BP:  116/68  Pulse:  74  Temp:  97.5 F (36.4 C)  TempSrc:  Oral  Height:  5\' 7"  (1.702 m)  Weight:  158 lb (71.668 kg)  SpO2: 71% 90%    Gen: Pleasant, well-nourished, in no distress,  normal affect  ENT: No lesions,  mouth clear,  oropharynx clear, no postnasal drip  Neck: 1+  JVD, no TMG, no carotid bruits  Lungs: No use of accessory muscles, no dullness to percussion, bibasilar dry rales  Cardiovascular: RRR, split P2 otherwise  heart sounds normal, no murmur or gallops, ++ peripheral edema  Abdomen: soft and NT, no HSM,  BS normal  Musculoskeletal: No deformities, no cyanosis or clubbing  Neuro: alert, non focal  Skin: Warm, no lesions or rashes  CT scan the chest and recent pulmonary function studies were reviewed  No results found.    Assessment & Plan:   Postinflammatory pulmonary fibrosis End-stage pulmonary fibrosis with chronic respiratory failure The patient has not yet received current supply of pirfenidone Plan Await supply pirfenidone and begin this therapy Continue current oxygen prescription Return 2 months    Updated Medication List Outpatient Encounter Prescriptions as of  06/26/2013  Medication Sig  . allopurinol (ZYLOPRIM) 300 MG tablet TAKE ONE (1) TABLET EACH DAY  . aspirin 81 MG tablet Take 81 mg by mouth daily.  Marland Kitchen atorvastatin (LIPITOR) 40 MG tablet Take 20 mg by mouth daily.   . digoxin (LANOXIN) 0.25 MG tablet Take 1 tablet (250 mcg total) by mouth daily.  . Flaxseed, Linseed, (FLAXSEED OIL) 1000 MG CAPS 1 cap twice a day  . furosemide (LASIX) 40 MG tablet Take 1 tablet (40 mg total) by mouth daily.  . isosorbide dinitrate (ISORDIL) 30 MG tablet Take 30 mg by mouth daily.  . metoprolol succinate (TOPROL-XL) 100 MG 24 hr tablet Take 1 tablet (100 mg total) by mouth daily. Take with or immediately following a  meal.  . potassium chloride SA (K-DUR,KLOR-CON) 20 MEQ tablet Take 1 tablet (20 mEq total) by mouth daily.  Marland Kitchen warfarin (COUMADIN) 5 MG tablet 7.5 mg on Sun, Weds 5 mg on Sat, Mon, Tues, Thurs, Fri As directed  . PIRFENIDONE 267 MG PO CAPS Take per protocol

## 2013-07-17 ENCOUNTER — Ambulatory Visit: Payer: Medicare Other | Admitting: Family Medicine

## 2013-07-19 ENCOUNTER — Other Ambulatory Visit: Payer: Self-pay | Admitting: Family Medicine

## 2013-08-07 ENCOUNTER — Ambulatory Visit (INDEPENDENT_AMBULATORY_CARE_PROVIDER_SITE_OTHER): Payer: Medicare Other | Admitting: Critical Care Medicine

## 2013-08-07 ENCOUNTER — Encounter: Payer: Self-pay | Admitting: Critical Care Medicine

## 2013-08-07 VITALS — BP 104/60 | HR 87 | Temp 97.5°F | Ht 67.0 in | Wt 161.0 lb

## 2013-08-07 DIAGNOSIS — J8410 Pulmonary fibrosis, unspecified: Secondary | ICD-10-CM

## 2013-08-07 NOTE — Progress Notes (Signed)
Subjective:    Patient ID: Jordan Bautista, male    DOB: 03-11-1929, 80 y.o.   MRN: [id number]  HPI  08/07/2013 Chief Complaint  Patient presents with  . 6 wk follow up    o2 sat 86% on 3 lpm pulsed upon arrival to exam room.  Breathing is unchanged.  Coughing in the mornings with very little mucus.  No wheezing or chest tightness/pain.  no change in dyspnea.  No real mucus. Pt is on 3L pulse at home.  No edema in feet.  Has not yet received Esbriet.  Past Medical History  Diagnosis Date  . Atrial fibrillation   . High cholesterol   . Hypertension   . Skin cancer   . Heart failure   . Gout 08/20/2012  . Dehydration 02/06/2013  . HTN (hypertension) 05/13/2013     Family History  Problem Relation Age of Onset  . Prostate cancer Neg Hx   . Colon cancer Neg Hx   . Breast cancer Mother   . Hypertension Neg Hx   . Heart disease Neg Hx   . Diabetes Son     2 sons     History   Social History  . Marital Status: Married    Spouse Name: N/A    Number of Children: N/A  . Years of Education: N/A   Occupational History  . retired     Psychologist, educational    Social History Main Topics  . Smoking status: Former Smoker -- 1.00 packs/day for 30 years    Types: Cigarettes, Pipe, Cigars    Quit date: 06/28/1978  . Smokeless tobacco: Never Used  . Alcohol Use: Yes  . Drug Use: No  . Sexual Activity: Not on file   Other Topics Concern  . Not on file   Social History Narrative  . No narrative on file     No Known Allergies   Outpatient Prescriptions Prior to Visit  Medication Sig Dispense Refill  . allopurinol (ZYLOPRIM) 300 MG tablet TAKE ONE (1) TABLET EACH DAY  30 tablet  4  . aspirin 81 MG tablet Take 81 mg by mouth daily.      Marland Kitchen atorvastatin (LIPITOR) 40 MG tablet Take 20 mg by mouth daily.       . digoxin (LANOXIN) 0.25 MG tablet Take 1 tablet (250 mcg total) by mouth daily.  90 tablet  1  . Flaxseed, Linseed, (FLAXSEED OIL) 1000 MG CAPS 1 cap twice a day    0  .  furosemide (LASIX) 40 MG tablet Take 1 tablet (40 mg total) by mouth daily.  30 tablet  5  . isosorbide dinitrate (ISORDIL) 30 MG tablet Take 30 mg by mouth daily.      . metoprolol succinate (TOPROL-XL) 100 MG 24 hr tablet Take 1 tablet (100 mg total) by mouth daily. Take with or immediately following a meal.  30 tablet  5  . potassium chloride SA (K-DUR,KLOR-CON) 20 MEQ tablet Take 1 tablet (20 mEq total) by mouth daily.  30 tablet  5  . warfarin (COUMADIN) 5 MG tablet 7.5 mg on Sun, Weds 5 mg on Sat, Mon, Tues, Thurs, Fri As directed      . PIRFENIDONE 267 MG PO CAPS Take per protocol  60 tablet  6   No facility-administered medications prior to visit.      Review of Systems  Constitutional: Positive for chills. Negative for diaphoresis, activity change, appetite change, fatigue and unexpected weight change.  HENT: Positive for  tinnitus. Negative for congestion, dental problem, ear discharge, facial swelling, hearing loss, mouth sores, nosebleeds, postnasal drip, sinus pressure, sneezing, trouble swallowing and voice change.   Eyes: Negative for photophobia, discharge, itching and visual disturbance.  Respiratory: Positive for cough and shortness of breath. Negative for apnea, choking, chest tightness and stridor.   Cardiovascular: Negative for palpitations.  Gastrointestinal: Negative for nausea, constipation, blood in stool and abdominal distention.  Genitourinary: Negative for dysuria, urgency, frequency, hematuria, flank pain, decreased urine volume and difficulty urinating.  Musculoskeletal: Negative for arthralgias, back pain, gait problem, joint swelling, myalgias and neck stiffness.  Skin: Positive for rash. Negative for color change and pallor.  Neurological: Positive for dizziness and headaches. Negative for tremors, seizures, syncope, speech difficulty, weakness, light-headedness and numbness.  Hematological: Negative for adenopathy. Bruises/bleeds easily.   Psychiatric/Behavioral: Negative for confusion, sleep disturbance and agitation. The patient is not nervous/anxious.        Objective:   Physical Exam  Filed Vitals:   08/07/13 1041 08/07/13 1047  BP:  104/60  Pulse:  87  Temp:  97.5 F (36.4 C)  TempSrc:  Oral  Height:  5\' 7"  (1.702 m)  Weight:  161 lb (73.029 kg)  SpO2: 86% 91%    Gen: Pleasant, well-nourished, in no distress,  normal affect  ENT: No lesions,  mouth clear,  oropharynx clear, no postnasal drip  Neck: 1+  JVD, no TMG, no carotid bruits  Lungs: No use of accessory muscles, no dullness to percussion, bibasilar dry rales  Cardiovascular: RRR, split P2 otherwise  heart sounds normal, no murmur or gallops, ++ peripheral edema  Abdomen: soft and NT, no HSM,  BS normal  Musculoskeletal: No deformities, no cyanosis or clubbing  Neuro: alert, non focal  Skin: Warm, no lesions or rashes   No results found.    Assessment & Plan:   Postinflammatory pulmonary fibrosis Severe pulm htn and pulm fibrosis Has not received esbriet as of yet Plan Called esbriet distributor to complain>>they will address No other changes     Updated Medication List Outpatient Encounter Prescriptions as of 08/07/2013  Medication Sig  . allopurinol (ZYLOPRIM) 300 MG tablet TAKE ONE (1) TABLET EACH DAY  . aspirin 81 MG tablet Take 81 mg by mouth daily.  Marland Kitchen atorvastatin (LIPITOR) 40 MG tablet Take 20 mg by mouth daily.   . digoxin (LANOXIN) 0.25 MG tablet Take 1 tablet (250 mcg total) by mouth daily.  . Flaxseed, Linseed, (FLAXSEED OIL) 1000 MG CAPS 1 cap twice a day  . furosemide (LASIX) 40 MG tablet Take 1 tablet (40 mg total) by mouth daily.  . isosorbide dinitrate (ISORDIL) 30 MG tablet Take 30 mg by mouth daily.  . metoprolol succinate (TOPROL-XL) 100 MG 24 hr tablet Take 1 tablet (100 mg total) by mouth daily. Take with or immediately following a meal.  . potassium chloride SA (K-DUR,KLOR-CON) 20 MEQ tablet Take 1 tablet  (20 mEq total) by mouth daily.  Marland Kitchen warfarin (COUMADIN) 5 MG tablet 7.5 mg on Sun, Weds 5 mg on Sat, Mon, Tues, Thurs, Fri As directed  . PIRFENIDONE 267 MG PO CAPS Take per protocol

## 2013-08-07 NOTE — Patient Instructions (Signed)
No change in medications We will insure you receive Esbriet asap Return 6 weeks

## 2013-08-09 NOTE — Assessment & Plan Note (Signed)
Severe pulm htn and pulm fibrosis Has not received esbriet as of yet Plan Called esbriet distributor to complain>>they will address No other changes

## 2013-08-17 ENCOUNTER — Ambulatory Visit (INDEPENDENT_AMBULATORY_CARE_PROVIDER_SITE_OTHER): Payer: Medicare Other | Admitting: Family Medicine

## 2013-08-17 ENCOUNTER — Encounter: Payer: Self-pay | Admitting: Family Medicine

## 2013-08-17 VITALS — BP 98/60 | HR 102 | Temp 97.5°F | Resp 18 | Ht 67.0 in | Wt 163.5 lb

## 2013-08-17 DIAGNOSIS — I4891 Unspecified atrial fibrillation: Secondary | ICD-10-CM

## 2013-08-17 DIAGNOSIS — I272 Pulmonary hypertension, unspecified: Secondary | ICD-10-CM

## 2013-08-17 DIAGNOSIS — Z7901 Long term (current) use of anticoagulants: Secondary | ICD-10-CM

## 2013-08-17 DIAGNOSIS — E785 Hyperlipidemia, unspecified: Secondary | ICD-10-CM

## 2013-08-17 DIAGNOSIS — I10 Essential (primary) hypertension: Secondary | ICD-10-CM

## 2013-08-17 DIAGNOSIS — I2789 Other specified pulmonary heart diseases: Secondary | ICD-10-CM

## 2013-08-17 LAB — RENAL FUNCTION PANEL
ALBUMIN: 4.3 g/dL (ref 3.5–5.2)
BUN: 50 mg/dL — AB (ref 6–23)
CHLORIDE: 98 meq/L (ref 96–112)
CO2: 33 meq/L — AB (ref 19–32)
CREATININE: 1.52 mg/dL — AB (ref 0.50–1.35)
Calcium: 9.4 mg/dL (ref 8.4–10.5)
GLUCOSE: 118 mg/dL — AB (ref 70–99)
Phosphorus: 3.1 mg/dL (ref 2.3–4.6)
Potassium: 4.8 mEq/L (ref 3.5–5.3)
Sodium: 140 mEq/L (ref 135–145)

## 2013-08-17 LAB — HEPATIC FUNCTION PANEL
ALBUMIN: 4.3 g/dL (ref 3.5–5.2)
ALK PHOS: 72 U/L (ref 39–117)
ALT: 13 U/L (ref 0–53)
AST: 19 U/L (ref 0–37)
Bilirubin, Direct: 0.2 mg/dL (ref 0.0–0.3)
Indirect Bilirubin: 0.8 mg/dL (ref 0.2–1.2)
TOTAL PROTEIN: 6.7 g/dL (ref 6.0–8.3)
Total Bilirubin: 1 mg/dL (ref 0.2–1.2)

## 2013-08-17 LAB — TSH: TSH: 1.676 u[IU]/mL (ref 0.350–4.500)

## 2013-08-17 LAB — LIPID PANEL
Cholesterol: 219 mg/dL — ABNORMAL HIGH (ref 0–200)
HDL: 39 mg/dL — ABNORMAL LOW (ref >39)
LDL CALC: 142 mg/dL — AB (ref 0–99)
Total CHOL/HDL Ratio: 5.6 Ratio
Triglycerides: 188 mg/dL — ABNORMAL HIGH (ref <150)
VLDL: 38 mg/dL (ref 0–40)

## 2013-08-17 LAB — CBC
HEMATOCRIT: 50.3 % (ref 39.0–52.0)
Hemoglobin: 16.7 g/dL (ref 13.0–17.0)
MCH: 28.7 pg (ref 26.0–34.0)
MCHC: 33.2 g/dL (ref 30.0–36.0)
MCV: 86.6 fL (ref 78.0–100.0)
Platelets: 168 10*3/uL (ref 150–400)
RBC: 5.81 MIL/uL (ref 4.22–5.81)
RDW: 18.8 % — ABNORMAL HIGH (ref 11.5–15.5)
WBC: 10.6 10*3/uL — AB (ref 4.0–10.5)

## 2013-08-17 NOTE — Assessment & Plan Note (Signed)
Tolerating LIpitor, avoid trans fats, increase activity as tolerated. Check lipid panel today

## 2013-08-17 NOTE — Progress Notes (Signed)
Pre visit review using our clinic review tool, if applicable. No additional management support is needed unless otherwise documented below in the visit note/SLS  

## 2013-08-17 NOTE — Assessment & Plan Note (Addendum)
Following closely with pulmonology and doing well, no concerns today, is tolerating new medication, Esbriet, thus far

## 2013-08-17 NOTE — Assessment & Plan Note (Signed)
Follows with [facility] annually, Dr Lewayne Bunting (sp?) and as needed they manage coumadin

## 2013-08-17 NOTE — Assessment & Plan Note (Signed)
welll cotnrolled no changes

## 2013-08-17 NOTE — Progress Notes (Signed)
Patient ID: Jordan Bautista, male   DOB: 1928-10-10, 80 y.o.   MRN: [id number] Jordan Bautista [id number] 1928-09-12 08/17/2013      Progress Note-Follow Up  Subjective  Chief Complaint  Chief Complaint  Patient presents with  . Follow-up    10-wk. [(Pulmonary) HTN; Hyperlipidemia]    HPI  Patient is an 80 year old Caucasian male who is in today for routine followup. Overall doing well. Has recently had cataract removed and his eyelids to due to his poor vision and has tolerated the procedure well but is continuing to have trouble with his vision he just one eye. She has an appointment with ophthalmology later this afternoon to discuss. No headache, fevers, chills, chest pain, palpitations or worsening shortness of breath. Notes his respiratory status is stable he is tolerating his new medication.  Past Medical History  Diagnosis Date  . Atrial fibrillation   . High cholesterol   . Hypertension   . Skin cancer   . Heart failure   . Gout 08/20/2012  . Dehydration 02/06/2013  . HTN (hypertension) 05/13/2013    Past Surgical History  Procedure Laterality Date  . Basal cell carcinoma excision  2013    Family History  Problem Relation Age of Onset  . Prostate cancer Neg Hx   . Colon cancer Neg Hx   . Breast cancer Mother   . Hypertension Neg Hx   . Heart disease Neg Hx   . Diabetes Son     2 sons    History   Social History  . Marital Status: Married    Spouse Name: N/A    Number of Children: N/A  . Years of Education: N/A   Occupational History  . retired     Psychologist, educational    Social History Main Topics  . Smoking status: Former Smoker -- 1.00 packs/day for 30 years    Types: Cigarettes, Pipe, Cigars    Quit date: 06/28/1978  . Smokeless tobacco: Never Used  . Alcohol Use: Yes  . Drug Use: No  . Sexual Activity: Not on file   Other Topics Concern  . Not on file   Social History Narrative  . No narrative on file    Current Outpatient Prescriptions on  File Prior to Visit  Medication Sig Dispense Refill  . allopurinol (ZYLOPRIM) 300 MG tablet TAKE ONE (1) TABLET EACH DAY  30 tablet  4  . aspirin 81 MG tablet Take 81 mg by mouth daily.      Marland Kitchen atorvastatin (LIPITOR) 40 MG tablet Take 20 mg by mouth daily.       . digoxin (LANOXIN) 0.25 MG tablet Take 1 tablet (250 mcg total) by mouth daily.  90 tablet  1  . Flaxseed, Linseed, (FLAXSEED OIL) 1000 MG CAPS 1 cap twice a day    0  . furosemide (LASIX) 40 MG tablet Take 1 tablet (40 mg total) by mouth daily.  30 tablet  5  . isosorbide dinitrate (ISORDIL) 30 MG tablet Take 30 mg by mouth daily.      . metoprolol succinate (TOPROL-XL) 100 MG 24 hr tablet Take 1 tablet (100 mg total) by mouth daily. Take with or immediately following a meal.  30 tablet  5  . potassium chloride SA (K-DUR,KLOR-CON) 20 MEQ tablet Take 1 tablet (20 mEq total) by mouth daily.  30 tablet  5  . warfarin (COUMADIN) 5 MG tablet 7.5 mg on Sun, Weds 5 mg on Sat, Mon, Tues, Thurs, Fri As directed  No current facility-administered medications on file prior to visit.    No Known Allergies  Review of Systems  Review of Systems  Constitutional: Negative for fever and malaise/fatigue.  HENT: Negative for congestion.   Eyes: Negative for discharge.  Respiratory: Negative for shortness of breath.   Cardiovascular: Negative for chest pain, palpitations and leg swelling.  Gastrointestinal: Negative for nausea, abdominal pain and diarrhea.  Genitourinary: Negative for dysuria.  Musculoskeletal: Negative for falls.  Skin: Negative for rash.  Neurological: Negative for loss of consciousness and headaches.  Endo/Heme/Allergies: Negative for polydipsia.  Psychiatric/Behavioral: Negative for depression and suicidal ideas. The patient is not nervous/anxious and does not have insomnia.     Objective  BP 98/60  Pulse 102  Temp(Src) 97.5 F (36.4 C) (Oral)  Resp 18  Ht 5\' 7"  (1.702 m)  Wt 163 lb 8 oz (74.163 kg)  BMI  25.60 kg/m2  SpO2 91%  Physical Exam  Physical Exam  Constitutional: He is oriented to person, place, and time and well-developed, well-nourished, and in no distress. No distress.  HENT:  Head: Normocephalic and atraumatic.  Eyes: Conjunctivae are normal.  Neck: Neck supple. No thyromegaly present.  Cardiovascular: Normal rate, regular rhythm and normal heart sounds.   No murmur heard. Pulmonary/Chest: Effort normal and breath sounds normal. No respiratory distress.  Abdominal: He exhibits no distension and no mass. There is no tenderness.  Musculoskeletal: He exhibits no edema.  Neurological: He is alert and oriented to person, place, and time.  Skin: Skin is warm.  Psychiatric: Memory, affect and judgment normal.    Lab Results  Component Value Date   TSH 2.224 01/30/2013   Lab Results  Component Value Date   WBC 7.6 04/23/2013   HGB 14.1 04/23/2013   HCT 43.3 04/23/2013   MCV 83.9 04/23/2013   PLT 216 04/23/2013   Lab Results  Component Value Date   CREATININE 1.30 04/23/2013   BUN 36* 04/23/2013   NA 140 04/23/2013   K 5.2 04/23/2013   CL 99 04/23/2013   CO2 32 04/23/2013   Lab Results  Component Value Date   ALT 16 04/23/2013   AST 14 04/23/2013   ALKPHOS 56 04/23/2013   BILITOT 1.4* 04/23/2013   Lab Results  Component Value Date   CHOL 129 01/30/2013   Lab Results  Component Value Date   HDL 28* 01/30/2013   Lab Results  Component Value Date   LDLCALC 77 01/30/2013   Lab Results  Component Value Date   TRIG 119 01/30/2013   Lab Results  Component Value Date   CHOLHDL 4.6 01/30/2013     Assessment & Plan  Pulmonary hypertension Following closely with pulmonology and doing well, no concerns today, is tolerating new medication, Esbriet, thus far  Hyperlipidemia Tolerating LIpitor, avoid trans fats, increase activity as tolerated. Check lipid panel today  HTN (hypertension) welll cotnrolled no changes  Atrial fibrillation Follows with  [facility] annually, Dr Lewayne Bunting (sp?) and as needed they manage coumadin

## 2013-08-20 ENCOUNTER — Telehealth: Payer: Self-pay | Admitting: Family Medicine

## 2013-08-20 NOTE — Telephone Encounter (Signed)
Relevant patient education assigned to patient using Emmi. ° °

## 2013-08-30 ENCOUNTER — Telehealth: Payer: Self-pay | Admitting: Critical Care Medicine

## 2013-08-30 ENCOUNTER — Other Ambulatory Visit: Payer: Self-pay | Admitting: Family Medicine

## 2013-08-30 NOTE — Telephone Encounter (Signed)
Spoke with [facility] Correct fax # given and PA form to be faxed to Dr Ileana Roup nurse in case this has not been received. Per Butch Penny, this has been faxed and nothing received in response yet.  Will frwd to [name] to keep an eye out for this PA form.

## 2013-08-31 NOTE — Telephone Encounter (Signed)
We did receive the PA request for Esbriet from [place]. I called [employer] at [phone], spoke with Tish. Was advised a fax will need to be sent for this particular medication -- it cannot be done over the phone.  She will send this to triage.   Also, per Trish, pt can receive the medication now as a transitional supply x 1 only, but all other fills will require this authorization.  Wannetta Sender also reports pt needs to be aware this medication will put him into the "coverage gap and into catastrophic" because it is so expensive, and he can contact his insurance costumer service for further information on this.    Will await PA fax and will call [place] [facility] on Monday to discuss above per Trish and see how we need to proceed.

## 2013-09-03 NOTE — Telephone Encounter (Signed)
Form received and filled out to the best of my ability Will hold to give to [place] when she returns to the office tomorrow morning Will route message to [place] for follow up and completion

## 2013-09-03 NOTE — Telephone Encounter (Signed)
Rayshone returned call She stated that it sounds like this would send pt into the donut hole with $2-3K copays Per [employer], the pt/caregiver/office can contact the [place] program to get pt some financial assistance  Obtained pt's original application for Esbriet in [place]'s office.  Attached to the form is a letter from [employer] stating: "This is to notify you Esbriet CareConnect has triaged the patient below to the [facility].  The pharmacy will be reaching out to the patient directly to coordinate their free trial of Esbriet.  We will proceed with identifying coverage for Esbriet and traiging your patient to the appropriate specialty pharmacy as applicable.  We will continue to update you on your patient's status."  Called the CareConnect number on the letter: [phone] and  Spoke with a CareConnect Nurse Case Manager >> Freada Bergeron.  Per Lorre Nick, this letter is pertaining to the 45 day free trial that pt received on 08/09/13 (15 tabs) and 08/22/13 and the assigning of [facility] as pt's specialty pharmacy.  Per Lorre Nick, CareConnect is there to connect the patient to whatever service they may need pertaining to this drug (ie if pt has insurance questions, CareConnects helps patient with contacting their insurance customer service; if patient needs copay assistance, they connect pt to that particular service thru the Manufacturer).  Lorre Nick stated that she will contact the patient to initiate this service.  Lucy recommends to continue working on the PA thru [place].    I have checked triage and do not see the form that [place] requested on 3.6.15.  [name], has this come across your desk yet?  I will be happy to request this again for you.

## 2013-09-03 NOTE — Telephone Encounter (Signed)
[facility] w/ [employer] is calling re:  PA for Exbiet & asked to be reached at [phone].  Satira Anis

## 2013-09-03 NOTE — Telephone Encounter (Signed)
LMOM TCB x1 for Jordan Bautista [name] mobile :: [phone], fax [phone] Jordan Bautista to ask for Triage or [name]

## 2013-09-03 NOTE — Telephone Encounter (Signed)
Called spoke with [facility] who stated that she was calling to check on the status of pt's PA for the [facility] again to initiate PA and request form as Esbriet PA cannot be completed over the phone (again verified per [employer]) - form to be sent to triage Pt's ID # [id number]  Will continue to wait on fax

## 2013-09-05 NOTE — Telephone Encounter (Signed)
Niki A calling a/b an urgent pre-authourization [facility] needs this asap  pls call [phone] asap.Hillery Hunter

## 2013-09-05 NOTE — Telephone Encounter (Signed)
I called # provided and spoke to a rep. He was not sure who it was that called. I provided an extension found in the note earlier in the week but no one answered and no voicemail. He states he will continue to try this ext and have them call back. Per [name] she has the forms and will have PW sign them first thing in the morning.  B[place] Bing, CMA

## 2013-09-06 NOTE — Telephone Encounter (Signed)
Mitzi Hansen w/ [facility] is calling regarding PA rec'd for exbriet.  They are needing additional info-PFT tests to be sent.  [employer] can be reached at  [phone] using Case# [id number].  This can be faxed to [phone].  Satira Anis

## 2013-09-06 NOTE — Telephone Encounter (Signed)
We have completed the esbriet PA.  PW has signed it.  I faxed it to [phone] as an expedited request.

## 2013-09-06 NOTE — Telephone Encounter (Signed)
Called and spoke with Amy w/ [facility]. Confirmed they are needing pt pft results faxed to [phone]. I advised will fax PFT's from pt chart. Will forward message back to crystal

## 2013-09-07 NOTE — Telephone Encounter (Signed)
Received APPROVAL letter from [facility] for [employer].  Called [facility], spoke with Butch Penny.  Informed her of this.  She verbalized understanding.  The APPROVAL letter did not have approval dates on it.  Per Butch Penny, because there are no approval dates, she can't promise ship dates at this time.  Butch Penny states she will investigate the dates further.  Once she has this information, they will contact pt.    Called, spoke with pt. Informed him of above.  He verbalized understanding of this.  Pt states he has approx 2 wks of medication left.  He is to call our office if he doesn't hear from [place] by next wk.

## 2013-09-07 NOTE — Telephone Encounter (Signed)
Called, spoke with Butch Penny with [facility] again to advise of how much esbriet pt currently has. Per Butch Penny, she now has the approval dates for Esbriet.  This is from 07/09/13 - 09/06/2014.   Butch Penny states the esbriet will ship from pharmacy any day now.  She couldn't give me an exact date and does know pt only has a few wks of medication at this point.  Called, spoke with pt. Informed him of above per Butch Penny.  He verbalized understanding and is to call back if he doesn't receive medication by the end of next wk.  He voiced no further questions or concerns at this time.

## 2013-09-18 ENCOUNTER — Encounter: Payer: Self-pay | Admitting: Critical Care Medicine

## 2013-09-18 ENCOUNTER — Ambulatory Visit (INDEPENDENT_AMBULATORY_CARE_PROVIDER_SITE_OTHER): Payer: Medicare Other | Admitting: Critical Care Medicine

## 2013-09-18 VITALS — BP 116/70 | HR 78 | Temp 98.1°F | Ht 67.0 in | Wt 161.0 lb

## 2013-09-18 DIAGNOSIS — J8410 Pulmonary fibrosis, unspecified: Secondary | ICD-10-CM

## 2013-09-18 NOTE — Progress Notes (Signed)
Subjective:    Patient ID: Jordan Bautista, male    DOB: 1928/08/29, 80 y.o.   MRN: [id number]  HPI   09/18/2013 Chief Complaint  Patient presents with  . Follow-up    pt c/o prod cough in a.m. with little mucous of unknown color.  No other breathing complaints at this time.   now on 3 tid of Esbriet for one month. No real nausea. No emesis.  Notes a min cough.  Pt has sunscreen.   No chest pain now.     Past Medical History  Diagnosis Date  . Atrial fibrillation   . High cholesterol   . Hypertension   . Skin cancer   . Heart failure   . Gout 08/20/2012  . Dehydration 02/06/2013  . HTN (hypertension) 05/13/2013       Family History  Problem Relation Age of Onset  . Prostate cancer Neg Hx   . Colon cancer Neg Hx   . Breast cancer Mother   . Hypertension Neg Hx   . Heart disease Neg Hx   . Diabetes Son     2 sons     History   Social History  . Marital Status: Married    Spouse Name: N/A    Number of Children: N/A  . Years of Education: N/A   Occupational History  . retired     Psychologist, educational    Social History Main Topics  . Smoking status: Former Smoker -- 1.00 packs/day for 30 years    Types: Cigarettes, Pipe, Cigars    Quit date: 06/28/1978  . Smokeless tobacco: Never Used  . Alcohol Use: Yes  . Drug Use: No  . Sexual Activity: Not on file   Other Topics Concern  . Not on file   Social History Narrative  . No narrative on file     No Known Allergies   Outpatient Prescriptions Prior to Visit  Medication Sig Dispense Refill  . allopurinol (ZYLOPRIM) 300 MG tablet TAKE ONE (1) TABLET EACH DAY  30 tablet  4  . aspirin 81 MG tablet Take 81 mg by mouth daily.      Marland Kitchen atorvastatin (LIPITOR) 40 MG tablet Take 40 mg by mouth daily.       . digoxin (LANOXIN) 0.25 MG tablet Take 1 tablet (250 mcg total) by mouth daily.  90 tablet  1  . Flaxseed, Linseed, (FLAXSEED OIL) 1000 MG CAPS 1 cap twice a day    0  . furosemide (LASIX) 40 MG tablet TAKE ONE (1)  TABLET EACH DAY  30 tablet  3  . isosorbide dinitrate (ISORDIL) 30 MG tablet Take 30 mg by mouth daily.      . metoprolol succinate (TOPROL-XL) 100 MG 24 hr tablet TAKE ONE (1) TABLET EACH DAY WITH OR IMMEDIATELY FOLLOWING A MEAL.  30 tablet  3  . Pirfenidone 267 MG CAPS Take per protocol-Week 1: 3 tabs daily; Week 2: 3 tablets BID; Week 3 & ongoing: 3 tablets TID      . potassium chloride SA (K-DUR,KLOR-CON) 20 MEQ tablet TAKE ONE (1) TABLET EACH DAY  30 tablet  3  . warfarin (COUMADIN) 5 MG tablet 7.5 mg on Sun, Weds 5 mg on Sat, Mon, Tues, Thurs, Fri As directed       No facility-administered medications prior to visit.      Review of Systems  Constitutional: Positive for chills. Negative for diaphoresis, activity change, appetite change, fatigue and unexpected weight change.  HENT: Positive for  tinnitus. Negative for congestion, dental problem, ear discharge, facial swelling, hearing loss, mouth sores, nosebleeds, postnasal drip, sinus pressure, sneezing, trouble swallowing and voice change.   Eyes: Negative for photophobia, discharge, itching and visual disturbance.  Respiratory: Positive for cough and shortness of breath. Negative for apnea, choking, chest tightness and stridor.   Cardiovascular: Negative for palpitations.  Gastrointestinal: Negative for nausea, constipation, blood in stool and abdominal distention.  Genitourinary: Negative for dysuria, urgency, frequency, hematuria, flank pain, decreased urine volume and difficulty urinating.  Musculoskeletal: Negative for arthralgias, back pain, gait problem, joint swelling, myalgias and neck stiffness.  Skin: Positive for rash. Negative for color change and pallor.  Neurological: Positive for dizziness and headaches. Negative for tremors, seizures, syncope, speech difficulty, weakness, light-headedness and numbness.  Hematological: Negative for adenopathy. Bruises/bleeds easily.  Psychiatric/Behavioral: Negative for confusion,  sleep disturbance and agitation. The patient is not nervous/anxious.        Objective:   Physical Exam  Filed Vitals:   09/18/13 1043 09/18/13 1044  BP:  116/70  Pulse: 78 78  Temp:  98.1 F (36.7 C)  TempSrc:  Oral  Height:  5\' 7"  (1.702 m)  Weight:  161 lb (73.029 kg)  SpO2: 86% 91%    Gen: Pleasant, well-nourished, in no distress,  normal affect  ENT: No lesions,  mouth clear,  oropharynx clear, no postnasal drip  Neck: 1+  JVD, no TMG, no carotid bruits  Lungs: No use of accessory muscles, no dullness to percussion, bibasilar dry rales  Cardiovascular: RRR, split P2 otherwise  heart sounds normal, no murmur or gallops, ++ peripheral edema  Abdomen: soft and NT, no HSM,  BS normal  Musculoskeletal: No deformities, no cyanosis or clubbing  Neuro: alert, non focal  Skin: Warm, no lesions or rashes   No results found.    Assessment & Plan:   Postinflammatory pulmonary fibrosis Pulmonary fibrosis with associated chronic respiratory failure stable at this time The patient is now on pirfenidone Plan Maintain pirfenidone 260 mg capsule 3   3 times daily Return 2 months with lab    Updated Medication List Outpatient Encounter Prescriptions as of 09/18/2013  Medication Sig  . allopurinol (ZYLOPRIM) 300 MG tablet TAKE ONE (1) TABLET EACH DAY  . aspirin 81 MG tablet Take 81 mg by mouth daily.  Marland Kitchen atorvastatin (LIPITOR) 40 MG tablet Take 40 mg by mouth daily.   . digoxin (LANOXIN) 0.25 MG tablet Take 1 tablet (250 mcg total) by mouth daily.  . Flaxseed, Linseed, (FLAXSEED OIL) 1000 MG CAPS 1 cap twice a day  . furosemide (LASIX) 40 MG tablet TAKE ONE (1) TABLET EACH DAY  . isosorbide dinitrate (ISORDIL) 30 MG tablet Take 30 mg by mouth daily.  . metoprolol succinate (TOPROL-XL) 100 MG 24 hr tablet TAKE ONE (1) TABLET EACH DAY WITH OR IMMEDIATELY FOLLOWING A MEAL.  Marland Kitchen Pirfenidone 267 MG CAPS Take per protocol-Week 1: 3 tabs daily; Week 2: 3 tablets BID; Week 3 &  ongoing: 3 tablets TID  . potassium chloride SA (K-DUR,KLOR-CON) 20 MEQ tablet TAKE ONE (1) TABLET EACH DAY  . warfarin (COUMADIN) 5 MG tablet 7.5 mg on Sun, Weds 5 mg on Sat, Mon, Tues, Thurs, Fri As directed

## 2013-09-18 NOTE — Patient Instructions (Signed)
No change in medications. Return in        2 months 

## 2013-09-19 NOTE — Assessment & Plan Note (Signed)
Pulmonary fibrosis with associated chronic respiratory failure stable at this time The patient is now on pirfenidone Plan Maintain pirfenidone 260 mg capsule 3   3 times daily Return 2 months with lab

## 2013-09-25 ENCOUNTER — Ambulatory Visit (INDEPENDENT_AMBULATORY_CARE_PROVIDER_SITE_OTHER): Payer: Medicare Other | Admitting: Nurse Practitioner

## 2013-09-25 ENCOUNTER — Encounter: Payer: Self-pay | Admitting: Nurse Practitioner

## 2013-09-25 VITALS — BP 105/69 | HR 74 | Temp 97.5°F | Resp 16 | Ht 67.0 in | Wt 161.0 lb

## 2013-09-25 DIAGNOSIS — L2089 Other atopic dermatitis: Secondary | ICD-10-CM

## 2013-09-25 DIAGNOSIS — L209 Atopic dermatitis, unspecified: Secondary | ICD-10-CM

## 2013-09-25 MED ORDER — CEPHALEXIN 250 MG PO CAPS: 250.0000 mg | ORAL_CAPSULE | Freq: Three times a day (TID) | ORAL | Status: DC

## 2013-09-25 MED ORDER — METHYLPREDNISOLONE ACETATE 40 MG/ML IJ SUSP
40.0000 mg | Freq: Once | INTRAMUSCULAR | Status: AC
  Administered 2013-09-25: 40 mg via INTRAMUSCULAR

## 2013-09-25 MED ORDER — HYDROXYZINE HCL 25 MG PO TABS: ORAL_TABLET | ORAL | Status: DC

## 2013-09-25 MED ORDER — LORATADINE 10 MG PO TABS: 10.0000 mg | ORAL_TABLET | Freq: Every day | ORAL | Status: DC

## 2013-09-25 MED ORDER — CEPHALEXIN 250 MG PO CAPS: 250.0000 mg | ORAL_CAPSULE | Freq: Four times a day (QID) | ORAL | Status: DC

## 2013-09-25 NOTE — Patient Instructions (Addendum)
I think this is eczema that has flared-possible due to seasonal allergies. Eczema requires multiple interventions to make it better: treat allergies, good skin, and medications. Also, you have to stop scratching to make it better. Antibiotics can help if you have flare, as you do. Start antibiotic.  For allergies: rinse eyes daily with saline drops, rinse sinuses daily with Neilmed sinus rinse. Take claritin daily-do all these through May.  For skin care: Use mild soap every 3-4 days-Dove bar soap. DO NOT USE SOAP DAILY. Never use liquid soap. OK to shower daily, but not with soap. Pat dry, don't rub. Apply lotion or vaseline within 5 minutes of shower to keep moisture in. Medications: antibiotic, antihistamine day and night to decrease scratching. Ice packs can reduce itching also. I want to see you in 3 weeks. If no improvement, I will refer to dermatologist.   Eczema Eczema, also called atopic dermatitis, is a skin disorder that causes inflammation of the skin. It causes a red rash and dry, scaly skin. The skin becomes very itchy. Eczema is generally worse during the cooler winter months and often improves with the warmth of summer. Eczema usually starts showing signs in infancy. Some children outgrow eczema, but it may last through adulthood.  CAUSES  The exact cause of eczema is not known, but it appears to run in families. People with eczema often have a family history of eczema, allergies, asthma, or hay fever. Eczema is not contagious. Flare-ups of the condition may be caused by:   Contact with something you are sensitive or allergic to.   Stress. SIGNS AND SYMPTOMS  Dry, scaly skin.   Red, itchy rash.   Itchiness. This may occur before the skin rash and may be very intense.  DIAGNOSIS  The diagnosis of eczema is usually made based on symptoms and medical history. TREATMENT  Eczema cannot be cured, but symptoms usually can be controlled with treatment and other strategies. A  treatment plan might include:  Controlling the itching and scratching.   Use over-the-counter antihistamines as directed for itching. This is especially useful at night when the itching tends to be worse.   Use over-the-counter steroid creams as directed for itching.   Avoid scratching. Scratching makes the rash and itching worse. It may also result in a skin infection (impetigo) due to a break in the skin caused by scratching.   Keeping the skin well moisturized with creams every day. This will seal in moisture and help prevent dryness. Lotions that contain alcohol and water should be avoided because they can dry the skin.   Limiting exposure to things that you are sensitive or allergic to (allergens).   Recognizing situations that cause stress.   Developing a plan to manage stress.  HOME CARE INSTRUCTIONS   Only take over-the-counter or prescription medicines as directed by your health care provider.   Do not use anything on the skin without checking with your health care provider.   Keep baths or showers short (5 minutes) in warm (not hot) water. Use mild cleansers for bathing. These should be unscented. You may add nonperfumed bath oil to the bath water. It is best to avoid soap and bubble bath.   Immediately after a bath or shower, when the skin is still damp, apply a moisturizing ointment to the entire body. This ointment should be a petroleum ointment. This will seal in moisture and help prevent dryness. The thicker the ointment, the better. These should be unscented.   Keep fingernails  cut short. Children with eczema may need to wear soft gloves or mittens at night after applying an ointment.   Dress in clothes made of cotton or cotton blends. Dress lightly, because heat increases itching.   A child with eczema should stay away from anyone with fever blisters or cold sores. The virus that causes fever blisters (herpes simplex) can cause a serious skin infection in  children with eczema. SEEK MEDICAL CARE IF:   Your itching interferes with sleep.   Your rash gets worse or is not better within 1 week after starting treatment.   You see pus or soft yellow scabs in the rash area.   You have a fever.   You have a rash flare-up after contact with someone who has fever blisters.  Document Released: 06/11/2000 Document Revised: 04/04/2013 Document Reviewed: 01/15/2013 [facility] Patient Information 2014 [place].

## 2013-09-25 NOTE — Progress Notes (Signed)
Subjective:     Jordan Bautista is an 80 y.o. male who presents for evaluation and treatment of a rash. He states he has had eczema on his back for 30 years & was treated by dermatology in [place].  In the last few months the rash has spread to arms, chest and more recently groin & face. Itching has become nearly intolerable. He has not been able to sleep last few nights due to pruritus. He is using an OTC cream with no relief.  He showers daily and uses liquid soaps. He lives with his wife who does not have rash.  The following portions of the patient's history were reviewed and updated as appropriate: allergies, current medications, past medical history, past social history, past surgical history and problem list.  Review of Systems Pertinent items are noted in HPI.   Objective:    BP 105/69  Pulse 74  Temp(Src) 97.5 F (36.4 C) (Oral)  Resp 16  Ht 5\' 7"  (1.702 m)  Wt 161 lb (73.029 kg)  BMI 25.21 kg/m2  SpO2 86% General appearance: alert, cooperative, appears stated age and mild distress Head: Normocephalic, without obvious abnormality, atraumatic Eyes: negative findings: lids and lashes normal and conjunctiva lower lids red Skin: eczema - face, torso, shoulder(s) bilateral, arm(s) bilateral, back, groin, erythema - sites of rash, excoriation - back, R chest/shoulder, bilat forearms and lichenification - R forearm, low back, L chest/shoulder   Assessment:    Eczema, gradually worsening  DD: scabies overlap Plan:    Follow up in 3 weeks. See skin instructions, allergy instructions, and keflex. Will refer to derm if no improvement.

## 2013-10-11 ENCOUNTER — Telehealth: Payer: Self-pay | Admitting: Nurse Practitioner

## 2013-10-11 NOTE — Telephone Encounter (Signed)
Insurance is denying hydroxyzine.

## 2013-10-12 NOTE — Telephone Encounter (Signed)
Patient notified. Pt stated that rash is better, however he is still having some issues with rash. Pt also stated that he has the med hydroxyzine already so he will wait until he sees you on 10/16/2013 to discuss any changes in med.

## 2013-10-16 ENCOUNTER — Encounter: Payer: Self-pay | Admitting: Nurse Practitioner

## 2013-10-16 ENCOUNTER — Ambulatory Visit (INDEPENDENT_AMBULATORY_CARE_PROVIDER_SITE_OTHER): Payer: Medicare Other | Admitting: Nurse Practitioner

## 2013-10-16 VITALS — BP 119/61 | HR 63 | Temp 98.4°F | Ht 67.0 in | Wt 159.0 lb

## 2013-10-16 DIAGNOSIS — H1045 Other chronic allergic conjunctivitis: Secondary | ICD-10-CM

## 2013-10-16 DIAGNOSIS — H1013 Acute atopic conjunctivitis, bilateral: Secondary | ICD-10-CM

## 2013-10-16 DIAGNOSIS — T7840XA Allergy, unspecified, initial encounter: Secondary | ICD-10-CM

## 2013-10-16 DIAGNOSIS — H01006 Unspecified blepharitis left eye, unspecified eyelid: Secondary | ICD-10-CM

## 2013-10-16 DIAGNOSIS — H01009 Unspecified blepharitis unspecified eye, unspecified eyelid: Secondary | ICD-10-CM

## 2013-10-16 MED ORDER — ERYTHROMYCIN 5 MG/GM OP OINT: 1.0000 "application " | TOPICAL_OINTMENT | Freq: Two times a day (BID) | OPHTHALMIC | Status: DC

## 2013-10-16 MED ORDER — BEPOTASTINE BESILATE 1.5 % OP SOLN: 1.0000 [drp] | Freq: Two times a day (BID) | OPHTHALMIC | Status: DC

## 2013-10-16 MED ORDER — OLOPATADINE HCL 0.1 % OP SOLN: 1.0000 [drp] | Freq: Two times a day (BID) | OPHTHALMIC | Status: DC

## 2013-10-16 MED ORDER — MONTELUKAST SODIUM 10 MG PO TABS: 10.0000 mg | ORAL_TABLET | Freq: Every day | ORAL | Status: DC

## 2013-10-16 NOTE — Progress Notes (Signed)
Pre visit review using our clinic review tool, if applicable. No additional management support is needed unless otherwise documented below in the visit note. 

## 2013-10-16 NOTE — Progress Notes (Signed)
Subjective:     Jordan Bautista is an 80 y.o. male who presents for follow up of eczema. He was seen on ofc 3 weeks ago with excoriation & lichenification on arms & back. Instructions were given for skin care changes, claritin & hydroxyzine started. This is long-standing problem-30 years.  Today arms & back look better-less lichenification but still scratching. Pt is using claritin & hydroxyzine daily, using less soap, but not using vaseline and/or lotion daily. His L lower eye lid is beefy red and everted. He c/o itchy eyes, no pain or vision change.  The following portions of the patient's history were reviewed and updated as appropriate: allergies, current medications, past medical history, past social history, past surgical history and problem list.  Review of Systems Pertinent items are noted in HPI.   Objective:    BP 119/61  Pulse 63  Temp(Src) 98.4 F (36.9 C) (Temporal)  Ht 5\' 7"  (1.702 m)  Wt 159 lb (72.122 kg)  BMI 24.90 kg/m2  SpO2 84% General appearance: alert, cooperative, appears stated age and no distress Head: Normocephalic, without obvious abnormality, atraumatic Eyes: positive findings: bilat conjunctiva are beefy red at lower lids. L lower lid is swollen and everted, scant crust on lashes. PERRL., sclera mildly erythematous. Nose: clear discharge Skin: eczema - generalized, face arms, back. lichenification improved, still has fresh scratch marks. Fewer scabs.   Assessment:    Eczema, gradually improving  Refer to allergist Blepharitis & allergic conjunctivits Plan:    added singulair, continue claritin & skin care instructions, start sinus rinses  Erythromycin ointment & pataday F/u 2 weeks.

## 2013-10-16 NOTE — Patient Instructions (Signed)
I think you have an overlap of allergy and infection in L eye. Both eyes look like chronic allergy. Use drops & ointment as directed. Space 5 minutes apart. Use ointment last. Vision will be blurry for a few minutes after using ointment. I have added singulair to help manage your allergic state. Use it everyday. It will help control your body's response to allergens. You will not notice much difference in the short term, but I hope you will in the long run. Please see allergist to help identify allergens contributing to eczema & rhinitis & conjunctivitis. Continue with skin care & meds prescribed at last visit for eczema. Minize scratching & use vaseline daily. Rinse sinuses. Follow up in 2 weeks to look at eye. Blepharitis Blepharitis is redness, soreness, and swelling (inflammation) of one or both eyelids. It may be caused by an allergic reaction or a bacterial infection. Blepharitis may also be associated with reddened, scaly skin (seborrhea) of the scalp and eyebrows. While you sleep, eye discharge may cause your eyelashes to stick together. Your eyelids may itch, burn, swell, and may lose their lashes. These will grow back. Your eyes may become sensitive. Blepharitis may recur and need repeated treatment. If this is the case, you may require further evaluation by an eye specialist (ophthalmologist). HOME CARE INSTRUCTIONS   Keep your hands clean.  Use a clean towel each time you dry your eyelids. Do not use this towel to clean other areas. Do not share a towel or makeup with anyone.  Wash your eyelids with warm water or warm water mixed with a small amount of baby shampoo. Do this twice a day or as often as needed.  Wash your face and eyebrows at least once a day.  Use warm compresses 2 times a day for 10 minutes at a time, or as directed by your caregiver.  Apply antibiotic ointment as directed by your caregiver.  Avoid rubbing your eyes.  Avoid wearing makeup until you get  better.  Follow up with your caregiver as directed. SEEK IMMEDIATE MEDICAL CARE IF:   You have pain, redness, or swelling that gets worse or spreads to other parts of your face.  Your vision changes, or you have pain when looking at lights or moving objects.  You have a fever.  Your symptoms continue for longer than 2 to 4 days or become worse. MAKE SURE YOU:   Understand these instructions.  Will watch your condition.  Will get help right away if you are not doing well or get worse. Document Released: 06/11/2000 Document Revised: 09/06/2011 Document Reviewed: 07/22/2010 [facility] Patient Information 2014 [place], [place].

## 2013-10-19 ENCOUNTER — Other Ambulatory Visit: Payer: Self-pay | Admitting: Family Medicine

## 2013-10-19 MED ORDER — OLOPATADINE HCL 0.2 % OP SOLN: 1.0000 [drp] | Freq: Every day | OPHTHALMIC | Status: DC

## 2013-10-30 ENCOUNTER — Ambulatory Visit (INDEPENDENT_AMBULATORY_CARE_PROVIDER_SITE_OTHER): Payer: Medicare Other | Admitting: Nurse Practitioner

## 2013-10-30 ENCOUNTER — Encounter: Payer: Self-pay | Admitting: Nurse Practitioner

## 2013-10-30 VITALS — BP 124/65 | HR 56 | Temp 98.6°F | Ht 67.0 in | Wt 160.0 lb

## 2013-10-30 DIAGNOSIS — L259 Unspecified contact dermatitis, unspecified cause: Secondary | ICD-10-CM

## 2013-10-30 DIAGNOSIS — H01009 Unspecified blepharitis unspecified eye, unspecified eyelid: Secondary | ICD-10-CM

## 2013-10-30 DIAGNOSIS — Z85828 Personal history of other malignant neoplasm of skin: Secondary | ICD-10-CM

## 2013-10-30 DIAGNOSIS — L309 Dermatitis, unspecified: Secondary | ICD-10-CM

## 2013-10-30 DIAGNOSIS — H01006 Unspecified blepharitis left eye, unspecified eyelid: Secondary | ICD-10-CM

## 2013-10-30 NOTE — Progress Notes (Signed)
Pre visit review using our clinic review tool, if applicable. No additional management support is needed unless otherwise documented below in the visit note. 

## 2013-10-30 NOTE — Progress Notes (Signed)
   Subjective:    Patient ID: Jordan Bautista, male    DOB: 02-11-1929, 80 y.o.   MRN: [id number]  HPI Comments: Pt presents for f/u of blepharitis and allergic conjunctivitis. He was treated with  Erythromycin ointment & pataday. Allergist referred him to optometrist due to worsening of condition. Optometrist started tobradex, d/c erythromycin & pataday. Pt also referred to opthalmologist for surgical consult to correct blepharitis L eye.  Eye Problem  The left eye is affected.This is a new problem. The current episode started 1 to 4 weeks ago (3 wks). The problem has been rapidly improving. There was no injury mechanism. The pain is mild (some pruritis-much improved). There is no known exposure to pink eye. He does not wear contacts. Associated symptoms include an eye discharge (scant, no crust, improved), eye redness (conjunctiva, lower L lid, improved) and itching. Pertinent negatives include no blurred vision, fever or nausea. Associated symptoms comments: Skin rash: eczema, possibly actinic keratoses, . Treatments tried: no improvement on erythromycin & pataday, dramatic improvement w/tobradex. The treatment provided significant relief.      Review of Systems  Constitutional: Negative for fever, chills, activity change, appetite change and fatigue.  HENT: Positive for congestion (chronic rhinitis). Negative for sinus pressure and sore throat.   Eyes: Positive for discharge (scant, no crust, improved), redness (conjunctiva, lower L lid, improved) and itching (mild, improved). Negative for blurred vision.  Gastrointestinal: Negative for nausea.  Skin: Positive for itching and rash (diffuse flaky skin hands, arms, face, ears. ).  Hematological: Negative for adenopathy.       Objective:   Physical Exam  Vitals reviewed. Constitutional: He is oriented to person, place, and time. He appears well-developed and well-nourished. No distress.  HENT:  Head: Normocephalic and atraumatic.  Eyes:  Right eye exhibits no discharge. Left eye exhibits no discharge.  Bilateral Conjunctiva of lids is much improved. Still minor redness at edge of L lower lid. No crust or scale.  Cardiovascular: Normal rate.   Pulmonary/Chest: Effort normal. No respiratory distress.  Neurological: He is alert and oriented to person, place, and time.  Skin: Skin is warm and dry. Rash (scaly rash face, hands, ears. no lichenification on arms, mild residual lichenification on back. Erythematous raised rash forearms.) noted.  Psychiatric: He has a normal mood and affect. His behavior is normal. Thought content normal.          Assessment & Plan:  1. Eczema 2. Blepharitis of left eye 3. History of basal cell cancer  See problem list for complete A&P F/u w/optholmology & allergist.

## 2013-10-30 NOTE — Patient Instructions (Signed)
Please keep opthalmology appointment . Check with allergist to see if they want to see you again regarding the recent treatments prescribed.   If no improvement on arms & face rash, and allergist does not address, please call my office & I will refer you to dermatology. Continue with skin care-dove soap no more than 3 times weekly. Lotion or vaseline daily. Continue claritin during day & hydroxyzine at night. Nice to see you!

## 2013-10-31 DIAGNOSIS — L309 Dermatitis, unspecified: Secondary | ICD-10-CM | POA: Insufficient documentation

## 2013-10-31 DIAGNOSIS — Z85828 Personal history of other malignant neoplasm of skin: Secondary | ICD-10-CM | POA: Insufficient documentation

## 2013-10-31 NOTE — Assessment & Plan Note (Addendum)
Face, arms, back. DD: psoriasis, actinic keratoses face & ears Referred to allergist who completed blood tests for allergy (No result info to review) & started topical steroid creams for face, arms & back. Continue with antihistamies, skin care routine, & steroid creams. If no continued improvement: refer to derm. F/u PRN.

## 2013-10-31 NOTE — Assessment & Plan Note (Signed)
Improvement on tobradex, prescribed by optometrist. Pt has pending appt. To see opthalmologist for surgery consultation to correct blepharitis L eye. Keep opthal appt.

## 2013-11-23 ENCOUNTER — Other Ambulatory Visit: Payer: Self-pay | Admitting: *Deleted

## 2013-11-23 DIAGNOSIS — L209 Atopic dermatitis, unspecified: Secondary | ICD-10-CM

## 2013-11-23 MED ORDER — HYDROXYZINE HCL 25 MG PO TABS: ORAL_TABLET | ORAL | Status: DC

## 2013-11-23 MED ORDER — LORATADINE 10 MG PO TABS: 10.0000 mg | ORAL_TABLET | Freq: Every day | ORAL | Status: DC

## 2013-11-23 NOTE — Telephone Encounter (Signed)
Refill request for hydroxyzine Last filled by MD on - 09/25/2013 #30 x1 Last Appt:10/30/2013 Next Appt:12/25/2013 Please advise refill?

## 2013-12-13 ENCOUNTER — Ambulatory Visit (INDEPENDENT_AMBULATORY_CARE_PROVIDER_SITE_OTHER): Payer: Medicare Other | Admitting: Critical Care Medicine

## 2013-12-13 ENCOUNTER — Encounter: Payer: Self-pay | Admitting: Critical Care Medicine

## 2013-12-13 VITALS — BP 104/60 | HR 62 | Temp 97.8°F | Ht 67.0 in | Wt 163.0 lb

## 2013-12-13 DIAGNOSIS — R892 Abnormal level of other drugs, medicaments and biological substances in specimens from other organs, systems and tissues: Secondary | ICD-10-CM

## 2013-12-13 DIAGNOSIS — J8410 Pulmonary fibrosis, unspecified: Secondary | ICD-10-CM

## 2013-12-13 DIAGNOSIS — J84112 Idiopathic pulmonary fibrosis: Secondary | ICD-10-CM

## 2013-12-13 LAB — CBC
HCT: 48.8 % (ref 39.0–52.0)
Hemoglobin: 16.4 g/dL (ref 13.0–17.0)
MCH: 29.7 pg (ref 26.0–34.0)
MCHC: 33.6 g/dL (ref 30.0–36.0)
MCV: 88.4 fL (ref 78.0–100.0)
PLATELETS: 176 10*3/uL (ref 150–400)
RBC: 5.52 MIL/uL (ref 4.22–5.81)
RDW: 17 % — AB (ref 11.5–15.5)
WBC: 9.7 10*3/uL (ref 4.0–10.5)

## 2013-12-13 NOTE — Patient Instructions (Signed)
Labs today No change  esbriet 3 three times daily Stop singulair We will obtain breathing test and CT Scan in 4 months prior to next visit Return 4 months

## 2013-12-13 NOTE — Progress Notes (Addendum)
Subjective:    Patient ID: Jordan Bautista, male    DOB: 10-Jun-1929, 80 y.o.   MRN: [id number]  HPI  12/13/2013 Chief Complaint  Patient presents with  . 3 month follow up    Breathing is unchanged.  Coughing a little in the morning - rarely prod.  No chest tightness/pain.  Pt on 3L oxygen and 3L qhs with cpap.  Dyspnea is the same.  No new issues.  No chest pain.  Pt denies any significant sore throat, nasal congestion or excess secretions, fever, chills, sweats, unintended weight loss, pleurtic or exertional chest pain, orthopnea PND, or leg swelling Pt denies any increase in rescue therapy over baseline, denies waking up needing it or having any early am or nocturnal exacerbations of coughing/wheezing/or dyspnea. Pt also denies any obvious fluctuation in symptoms with  weather or environmental change or other alleviating or aggravating factors     Review of Systems  Constitutional: Negative for chills, diaphoresis, activity change, appetite change, fatigue and unexpected weight change.  HENT: Negative for congestion, dental problem, ear discharge, facial swelling, hearing loss, mouth sores, nosebleeds, postnasal drip, sinus pressure, sneezing, tinnitus, trouble swallowing and voice change.   Eyes: Negative for photophobia, discharge, itching and visual disturbance.  Respiratory: Positive for shortness of breath. Negative for apnea, cough, choking, chest tightness and stridor.   Cardiovascular: Negative for palpitations.  Gastrointestinal: Negative for nausea, constipation, blood in stool and abdominal distention.  Genitourinary: Negative for dysuria, urgency, frequency, hematuria, flank pain, decreased urine volume and difficulty urinating.  Musculoskeletal: Negative for arthralgias, back pain, gait problem, joint swelling, myalgias and neck stiffness.  Skin: Positive for rash. Negative for color change and pallor.  Neurological: Negative for dizziness, tremors, seizures, syncope,  speech difficulty, weakness, light-headedness, numbness and headaches.  Hematological: Negative for adenopathy. Bruises/bleeds easily.  Psychiatric/Behavioral: Negative for confusion, sleep disturbance and agitation. The patient is not nervous/anxious.        Objective:   Physical Exam  Filed Vitals:   12/13/13 1128  BP: 104/60  Pulse: 62  Temp: 97.8 F (36.6 C)  TempSrc: Oral  Height: 5\' 7"  (1.702 m)  Weight: 163 lb (73.936 kg)  SpO2: 91%    Gen: Pleasant, well-nourished, in no distress,  normal affect  ENT: No lesions,  mouth clear,  oropharynx clear, no postnasal drip  Neck: 1+  JVD, no TMG, no carotid bruits  Lungs: No use of accessory muscles, no dullness to percussion, bibasilar dry rales  Cardiovascular: RRR, split P2 otherwise  heart sounds normal, no murmur or gallops, ++ peripheral edema  Abdomen: soft and NT, no HSM,  BS normal  Musculoskeletal: No deformities, no cyanosis or clubbing  Neuro: alert, non focal  Skin: Warm, no lesions or rashes   Recent Labs Lab 12/13/13 1215  HGB 16.4  HCT 48.8  WBC 9.7  PLT 176    Recent Labs Lab 12/13/13 1215  AST 20  ALT 14  ALKPHOS 82  BILITOT 0.7  PROT 6.5  ALBUMIN 3.9    No results found.    Assessment & Plan:   IPF (idiopathic pulmonary fibrosis) ILD/IPF Chronic resp failure pulm HTN Plan Cont esbriet 3 tid Note labs ok     Updated Medication List Outpatient Encounter Prescriptions as of 12/13/2013  Medication Sig  . allopurinol (ZYLOPRIM) 300 MG tablet TAKE ONE (1) TABLET EACH DAY  . aspirin 81 MG tablet Take 81 mg by mouth daily.  Marland Kitchen atorvastatin (LIPITOR) 40 MG tablet Take 40 mg by  mouth daily.   Marland Kitchen desonide (DESOWEN) 0.05 % ointment Apply 1 application topically 2 (two) times daily.  . digoxin (LANOXIN) 0.25 MG tablet Take 1 tablet (250 mcg total) by mouth daily.  . Flaxseed, Linseed, (FLAXSEED OIL) 1000 MG CAPS 1 cap twice a day  . furosemide (LASIX) 40 MG tablet TAKE ONE (1)  TABLET EACH DAY  . hydrOXYzine (ATARAX/VISTARIL) 25 MG tablet Take 1/2 to 1 T po qhs for 3 weeks, then QHS PRN  . isosorbide dinitrate (ISORDIL) 30 MG tablet Take 30 mg by mouth daily.  Marland Kitchen loratadine (CLARITIN) 10 MG tablet Take 1 tablet (10 mg total) by mouth daily.  . metoprolol succinate (TOPROL-XL) 100 MG 24 hr tablet TAKE ONE (1) TABLET EACH DAY WITH OR IMMEDIATELY FOLLOWING A MEAL.  Marland Kitchen Olopatadine HCl 0.2 % SOLN Apply 1 drop to eye daily as needed.  . Pirfenidone 267 MG CAPS 3 capsules 3 (three) times daily with meals.   . potassium chloride SA (K-DUR,KLOR-CON) 20 MEQ tablet TAKE ONE (1) TABLET EACH DAY  . tobramycin-dexamethasone (TOBRADEX) ophthalmic ointment Place 1 application into both eyes 3 (three) times daily as needed.   . triamcinolone (NASACORT AQ) 55 MCG/ACT AERO nasal inhaler USE 1 SPRAY IN EACH NOSTRIL DAILY as needed  . triamcinolone ointment (KENALOG) 0.1 % Apply 1 application topically 2 (two) times daily.  Marland Kitchen warfarin (COUMADIN) 5 MG tablet 7.5 mg on Sun, Weds 5 mg on Sat, Mon, Tues, Thurs, Fri As directed  . [DISCONTINUED] montelukast (SINGULAIR) 10 MG tablet Take 1 tablet (10 mg total) by mouth at bedtime.  . [DISCONTINUED] Olopatadine HCl 0.2 % SOLN Apply 1 drop to eye daily.  . [DISCONTINUED] erythromycin ophthalmic ointment Place 1 application into the left eye 2 (two) times daily.

## 2013-12-14 ENCOUNTER — Other Ambulatory Visit: Payer: Self-pay | Admitting: Family Medicine

## 2013-12-14 LAB — HEPATIC FUNCTION PANEL
ALBUMIN: 3.9 g/dL (ref 3.5–5.2)
ALK PHOS: 82 U/L (ref 39–117)
ALT: 14 U/L (ref 0–53)
AST: 20 U/L (ref 0–37)
BILIRUBIN DIRECT: 0.1 mg/dL (ref 0.0–0.3)
BILIRUBIN TOTAL: 0.7 mg/dL (ref 0.2–1.2)
Indirect Bilirubin: 0.6 mg/dL (ref 0.2–1.2)
Total Protein: 6.5 g/dL (ref 6.0–8.3)

## 2013-12-14 NOTE — Progress Notes (Signed)
Quick Note:  Call pt and tell him labs are ok, No change in medications ______

## 2013-12-14 NOTE — Progress Notes (Signed)
Quick Note:  Called, spoke with pt. Informed him of lab results and recs per Dr. Wright. He verbalized understanding and voiced no further questions or concerns at this time. ______ 

## 2013-12-15 NOTE — Assessment & Plan Note (Addendum)
ILD/IPF Chronic resp failure pulm HTN Plan Cont esbriet 3 tid Note labs ok

## 2013-12-17 NOTE — Telephone Encounter (Signed)
Rx request to pharmacy/[facility]  

## 2013-12-18 ENCOUNTER — Ambulatory Visit: Payer: Medicare Other | Admitting: Family Medicine

## 2013-12-24 IMAGING — CR DG CHEST 2V
2 series · 2 of 2 positions shown · non-contrast
Comparison: 11/30/2011. CT scan [REDACTED]
12/02/2011.

CLINICAL DATA: Pulmonary hypertension.

EXAM:
CHEST  2 VIEW

[w chest pa]
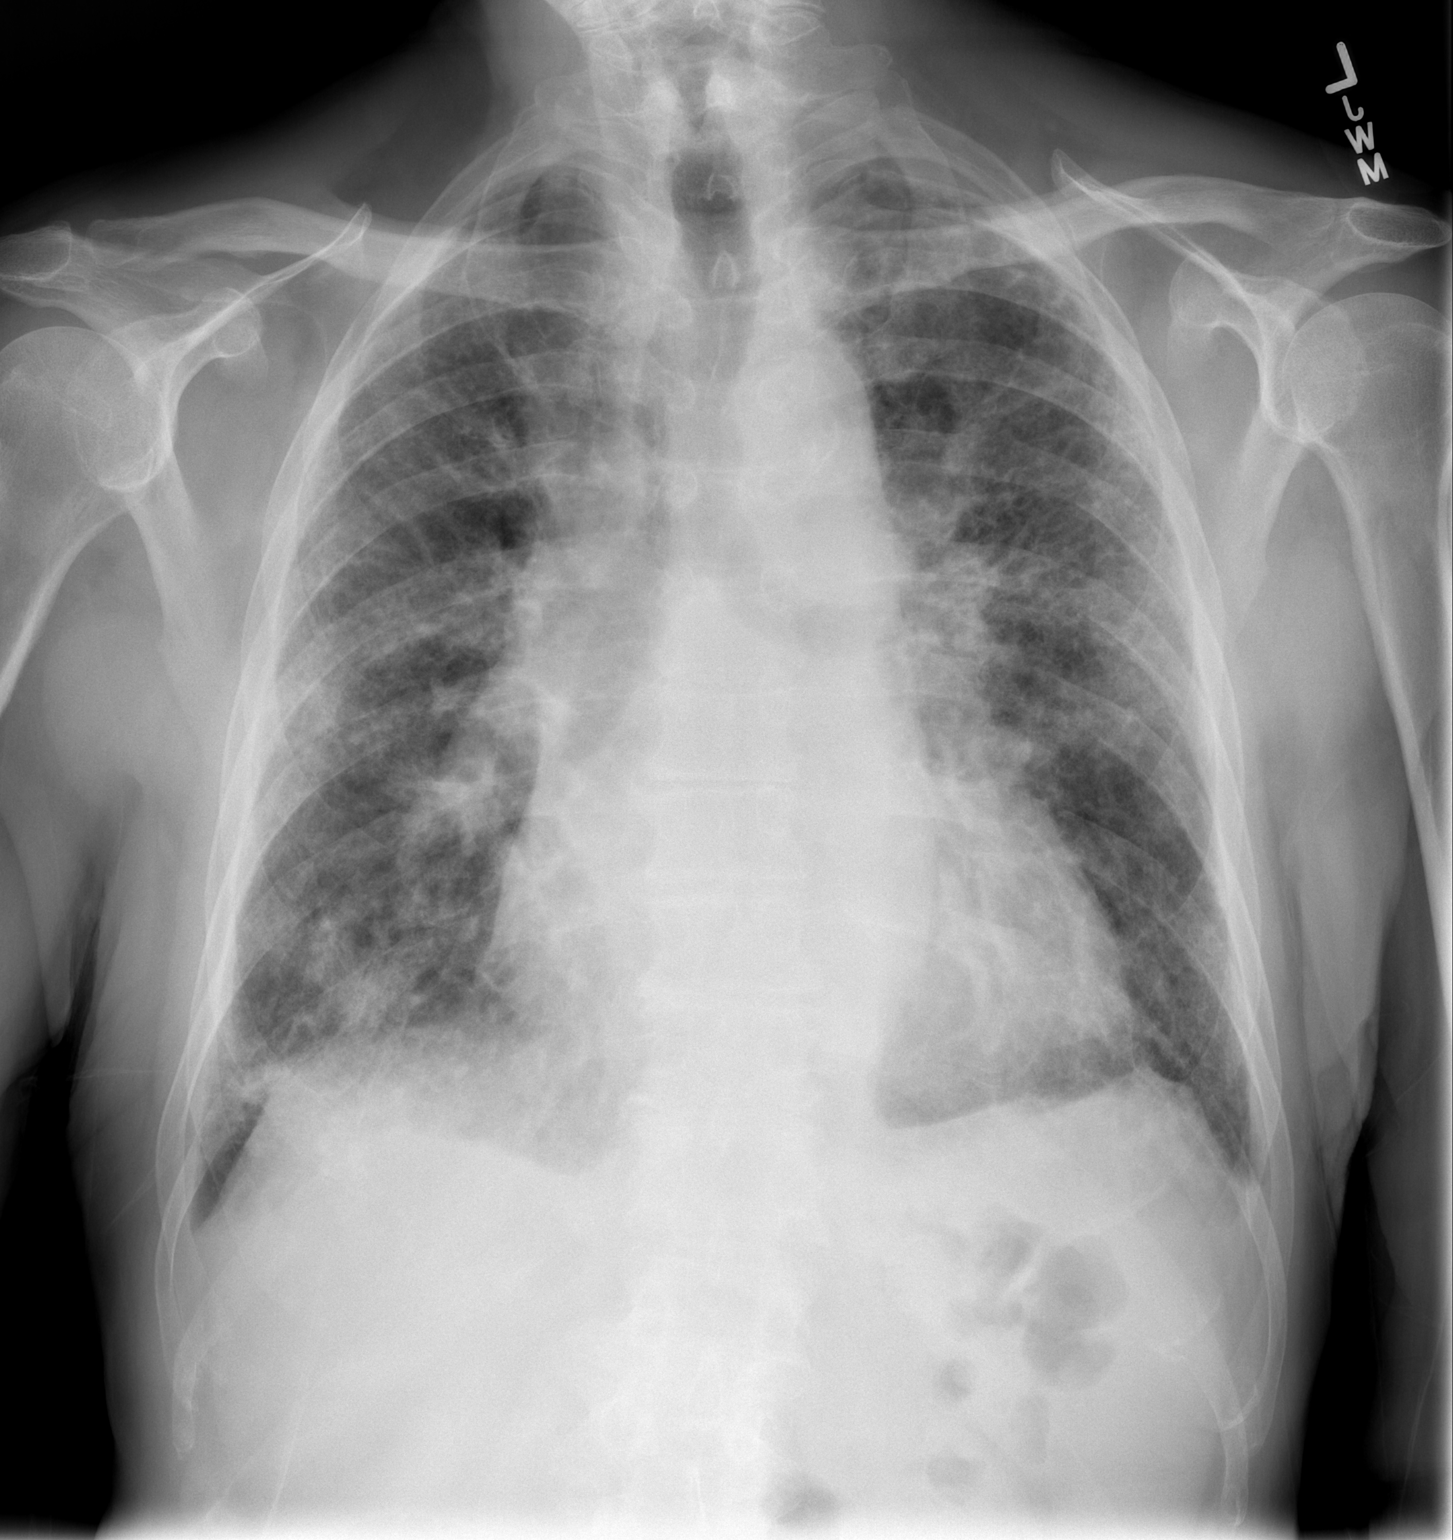

[w chest lat]
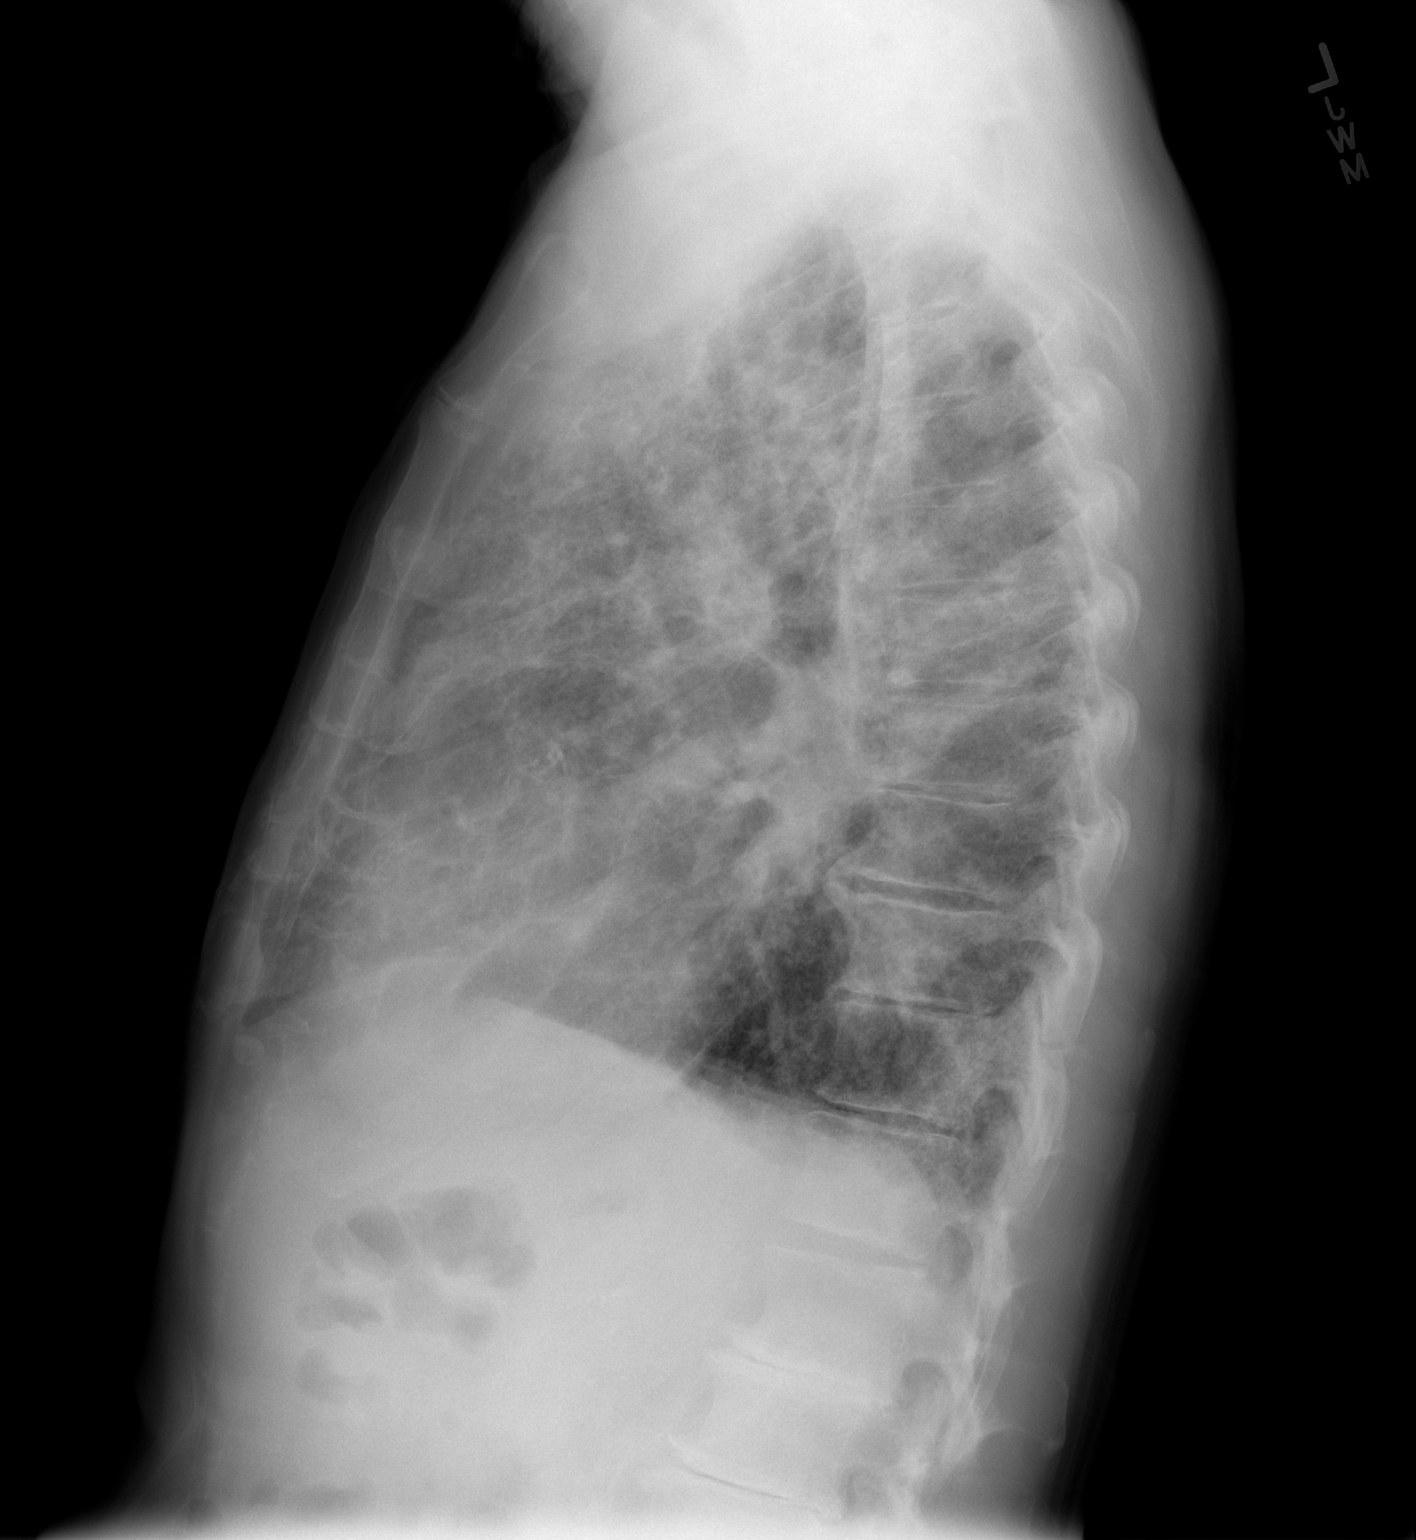

[2 of 2 positions shown; findings below may reference images not displayed]

FINDINGS: There is little interval change in the chest compared to the prior
exam. The nodular density at the medial right lung base appears more
diffuse on today's exam and No discrete nodule can be identified.
There is no pleural effusion. Diffuse pulmonary fibrosis is present.
Mediastinal contours appear unchanged. Aortic arch atherosclerosis.
Small area of airspace disease is present near the right
costophrenic angle seen on the frontal view. This may represent more
acute phase interstitial pneumonitis or infection. There is
enlargement of the pulmonary arteries on the lateral view,
compatible with pulmonary arterial hypertension.
IMPRESSION: Overall findings most compatible with interstitial lung disease with
pulmonary fibrosis with pulmonary arterial hypertension. Scattered
alveolar densities may represent superimposed acute infection or
acute phase interstitial disease. No convincing evidence of failure.

## 2013-12-25 ENCOUNTER — Encounter: Payer: Self-pay | Admitting: Family Medicine

## 2013-12-25 ENCOUNTER — Ambulatory Visit (INDEPENDENT_AMBULATORY_CARE_PROVIDER_SITE_OTHER): Payer: Medicare Other | Admitting: Family Medicine

## 2013-12-25 VITALS — BP 110/80 | HR 72 | Temp 98.4°F | Ht 67.0 in | Wt 161.1 lb

## 2013-12-25 DIAGNOSIS — E785 Hyperlipidemia, unspecified: Secondary | ICD-10-CM

## 2013-12-25 DIAGNOSIS — I10 Essential (primary) hypertension: Secondary | ICD-10-CM

## 2013-12-25 DIAGNOSIS — L309 Dermatitis, unspecified: Secondary | ICD-10-CM

## 2013-12-25 DIAGNOSIS — I4891 Unspecified atrial fibrillation: Secondary | ICD-10-CM

## 2013-12-25 DIAGNOSIS — M1A00X0 Idiopathic chronic gout, unspecified site, without tophus (tophi): Secondary | ICD-10-CM

## 2013-12-25 DIAGNOSIS — R7309 Other abnormal glucose: Secondary | ICD-10-CM

## 2013-12-25 DIAGNOSIS — M1A0790 Idiopathic chronic gout, unspecified ankle and foot, without tophus (tophi): Secondary | ICD-10-CM

## 2013-12-25 DIAGNOSIS — R739 Hyperglycemia, unspecified: Secondary | ICD-10-CM

## 2013-12-25 DIAGNOSIS — L259 Unspecified contact dermatitis, unspecified cause: Secondary | ICD-10-CM

## 2013-12-25 DIAGNOSIS — I482 Chronic atrial fibrillation, unspecified: Secondary | ICD-10-CM

## 2013-12-25 LAB — HEPATIC FUNCTION PANEL
ALT: 17 U/L (ref 0–53)
AST: 18 U/L (ref 0–37)
Albumin: 4.1 g/dL (ref 3.5–5.2)
Alkaline Phosphatase: 86 U/L (ref 39–117)
BILIRUBIN DIRECT: 0.2 mg/dL (ref 0.0–0.3)
Indirect Bilirubin: 0.6 mg/dL (ref 0.2–1.2)
Total Bilirubin: 0.8 mg/dL (ref 0.2–1.2)
Total Protein: 6.2 g/dL (ref 6.0–8.3)

## 2013-12-25 LAB — LIPID PANEL
CHOL/HDL RATIO: 5.8 ratio
CHOLESTEROL: 225 mg/dL — AB (ref 0–200)
HDL: 39 mg/dL — ABNORMAL LOW (ref >39)
LDL Cholesterol: 153 mg/dL — ABNORMAL HIGH (ref 0–99)
TRIGLYCERIDES: 167 mg/dL — AB (ref <150)
VLDL: 33 mg/dL (ref 0–40)

## 2013-12-25 LAB — RENAL FUNCTION PANEL
Albumin: 4.1 g/dL (ref 3.5–5.2)
BUN: 32 mg/dL — ABNORMAL HIGH (ref 6–23)
CALCIUM: 9.3 mg/dL (ref 8.4–10.5)
CO2: 29 mEq/L (ref 19–32)
Chloride: 99 mEq/L (ref 96–112)
Creat: 1.25 mg/dL (ref 0.50–1.35)
GLUCOSE: 96 mg/dL (ref 70–99)
PHOSPHORUS: 2.9 mg/dL (ref 2.3–4.6)
POTASSIUM: 4.4 meq/L (ref 3.5–5.3)
SODIUM: 140 meq/L (ref 135–145)

## 2013-12-25 LAB — CBC
HCT: 49.1 % (ref 39.0–52.0)
HEMOGLOBIN: 16.8 g/dL (ref 13.0–17.0)
MCH: 29.9 pg (ref 26.0–34.0)
MCHC: 34.2 g/dL (ref 30.0–36.0)
MCV: 87.4 fL (ref 78.0–100.0)
PLATELETS: 156 10*3/uL (ref 150–400)
RBC: 5.62 MIL/uL (ref 4.22–5.81)
RDW: 16.9 % — ABNORMAL HIGH (ref 11.5–15.5)
WBC: 9.4 10*3/uL (ref 4.0–10.5)

## 2013-12-25 LAB — URIC ACID: Uric Acid, Serum: 5.3 mg/dL (ref 4.0–7.8)

## 2013-12-25 LAB — TSH: TSH: 2.17 u[IU]/mL (ref 0.350–4.500)

## 2013-12-25 LAB — HEMOGLOBIN A1C
Hgb A1c MFr Bld: 6 % — ABNORMAL HIGH (ref <5.7)
Mean Plasma Glucose: 126 mg/dL — ABNORMAL HIGH (ref <117)

## 2013-12-25 MED ORDER — NYSTATIN-TRIAMCINOLONE 100000-0.1 UNIT/GM-% EX OINT: 1.0000 "application " | TOPICAL_OINTMENT | Freq: Two times a day (BID) | CUTANEOUS | Status: DC

## 2013-12-25 NOTE — Progress Notes (Signed)
Pre visit review using our clinic review tool, if applicable. No additional management support is needed unless otherwise documented below in the visit note. 

## 2013-12-25 NOTE — Patient Instructions (Signed)
Call with name of dermatologist Try Thermacare heat patches and moist heat for 15 minutes twice a day  Eczema Eczema, also called atopic dermatitis, is a skin disorder that causes inflammation of the skin. It causes a red rash and dry, scaly skin. The skin becomes very itchy. Eczema is generally worse during the cooler winter months and often improves with the warmth of summer. Eczema usually starts showing signs in infancy. Some children outgrow eczema, but it may last through adulthood.  CAUSES  The exact cause of eczema is not known, but it appears to run in families. People with eczema often have a family history of eczema, allergies, asthma, or hay fever. Eczema is not contagious. Flare-ups of the condition may be caused by:   Contact with something you are sensitive or allergic to.   Stress. SIGNS AND SYMPTOMS  Dry, scaly skin.   Red, itchy rash.   Itchiness. This may occur before the skin rash and may be very intense.  DIAGNOSIS  The diagnosis of eczema is usually made based on symptoms and medical history. TREATMENT  Eczema cannot be cured, but symptoms usually can be controlled with treatment and other strategies. A treatment plan might include:  Controlling the itching and scratching.   Use over-the-counter antihistamines as directed for itching. This is especially useful at night when the itching tends to be worse.   Use over-the-counter steroid creams as directed for itching.   Avoid scratching. Scratching makes the rash and itching worse. It may also result in a skin infection (impetigo) due to a break in the skin caused by scratching.   Keeping the skin well moisturized with creams every day. This will seal in moisture and help prevent dryness. Lotions that contain alcohol and water should be avoided because they can dry the skin.   Limiting exposure to things that you are sensitive or allergic to (allergens).   Recognizing situations that cause stress.    Developing a plan to manage stress.  HOME CARE INSTRUCTIONS   Only take over-the-counter or prescription medicines as directed by your health care provider.   Do not use anything on the skin without checking with your health care provider.   Keep baths or showers short (5 minutes) in warm (not hot) water. Use mild cleansers for bathing. These should be unscented. You may add nonperfumed bath oil to the bath water. It is best to avoid soap and bubble bath.   Immediately after a bath or shower, when the skin is still damp, apply a moisturizing ointment to the entire body. This ointment should be a petroleum ointment. This will seal in moisture and help prevent dryness. The thicker the ointment, the better. These should be unscented.   Keep fingernails cut short. Children with eczema may need to wear soft gloves or mittens at night after applying an ointment.   Dress in clothes made of cotton or cotton blends. Dress lightly, because heat increases itching.   A child with eczema should stay away from anyone with fever blisters or cold sores. The virus that causes fever blisters (herpes simplex) can cause a serious skin infection in children with eczema. SEEK MEDICAL CARE IF:   Your itching interferes with sleep.   Your rash gets worse or is not better within 1 week after starting treatment.   You see pus or soft yellow scabs in the rash area.   You have a fever.   You have a rash flare-up after contact with someone who has fever  blisters.  Document Released: 06/11/2000 Document Revised: 04/04/2013 Document Reviewed: 01/15/2013 [facility] Patient Information 2015 [place], [place]. This information is not intended to replace advice given to you by your health care provider. Make sure you discuss any questions you have with your health care provider.

## 2013-12-26 IMAGING — CT CT CHEST W/O CM
2 of 5 series · 15 of 36 positions shown, 18 images · non-contrast
Comparison: Chest CT 12/02/2011.

CLINICAL DATA: Evaluate for potential pulmonary fibrosis. History
of smoking.

EXAM:
CT CHEST WITHOUT CONTRAST
TECHNIQUE: Multidetector CT imaging of the chest was performed following the
standard protocol without IV contrast.

[Series 2: high res std 3.0 st · axial · 0.62mm/px · z∈[-314,-11]mm · 12 of 111 slices shown, 15 images]
[im 5/111  mediastinal]
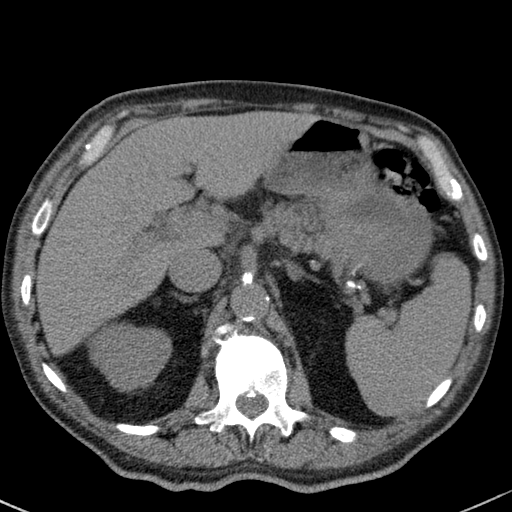
[im 5/111  lung]
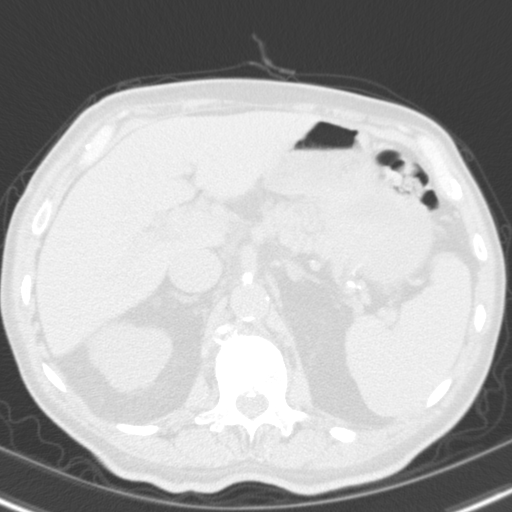
[im 15/111  lung]
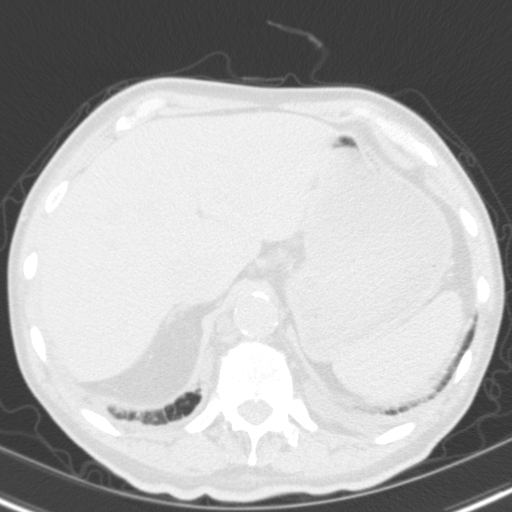
[im 24/111  lung]
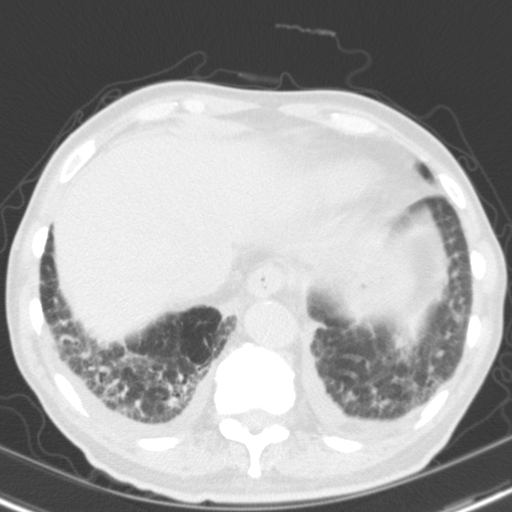
[im 34/111  lung]
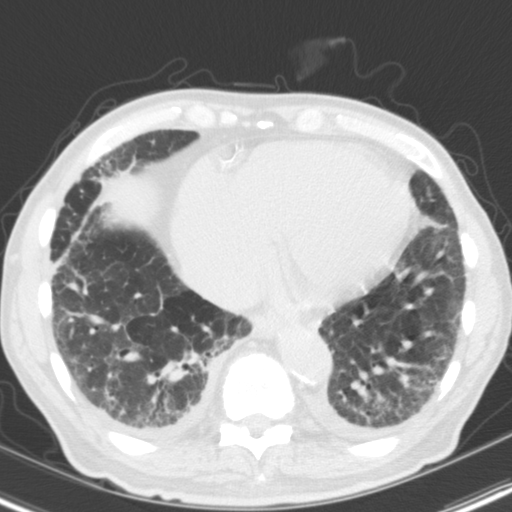
[im 44/111  mediastinal]
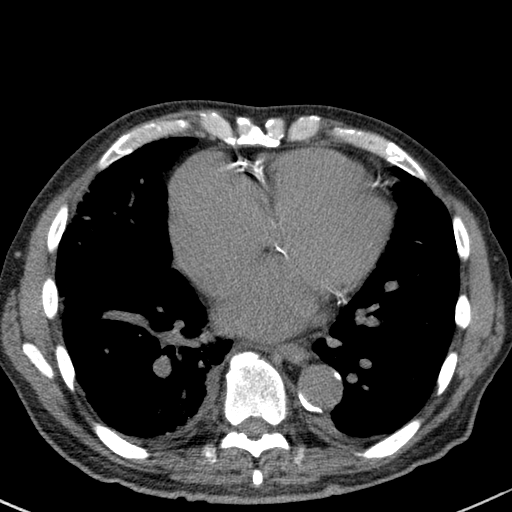
[im 44/111  lung]
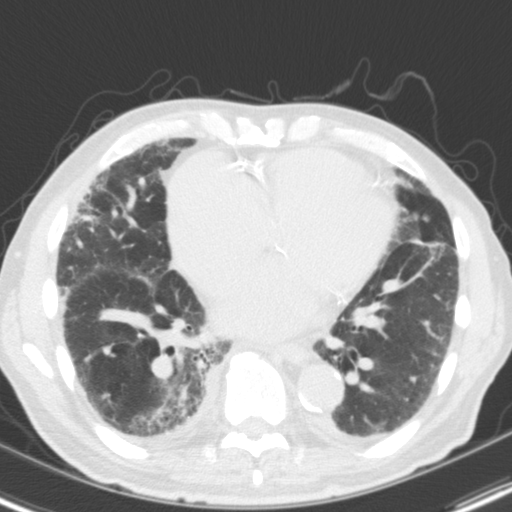
[im 53/111  lung]
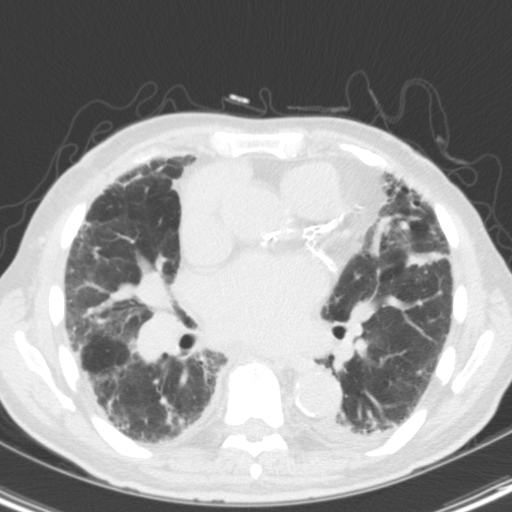
[im 58/111  lung]
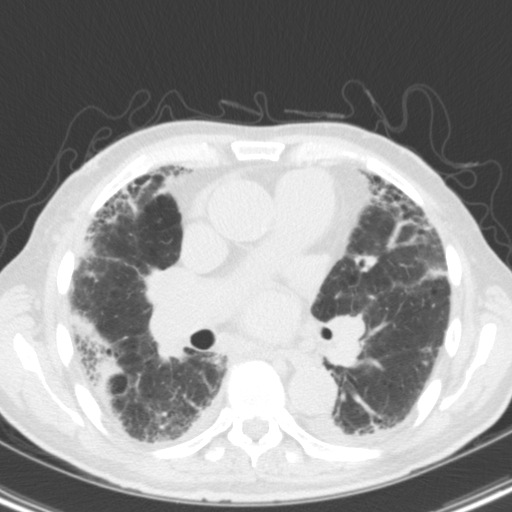
[im 67/111  lung]
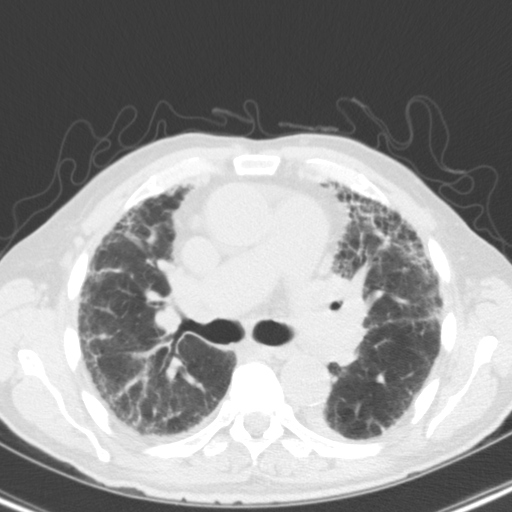
[im 77/111  mediastinal]
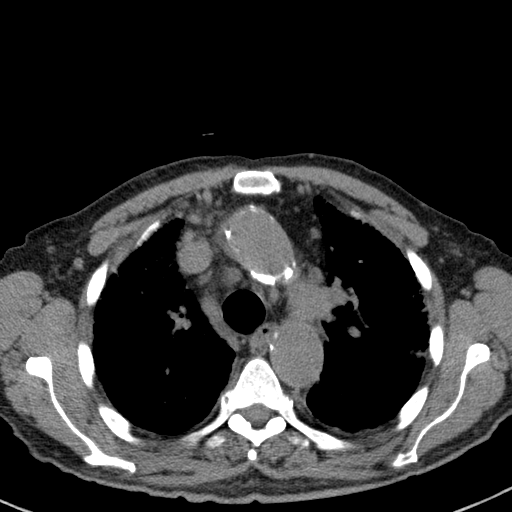
[im 77/111  lung]
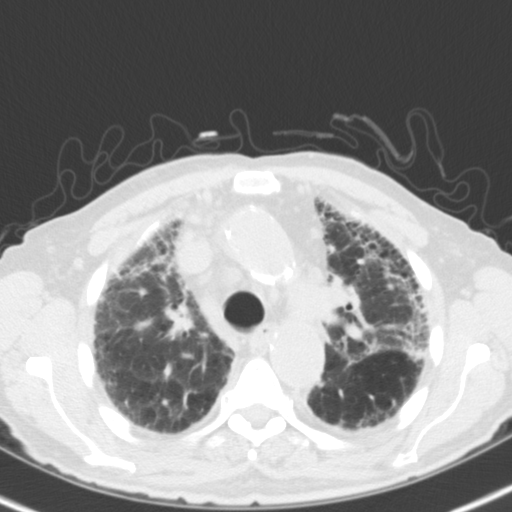
[im 87/111  lung]
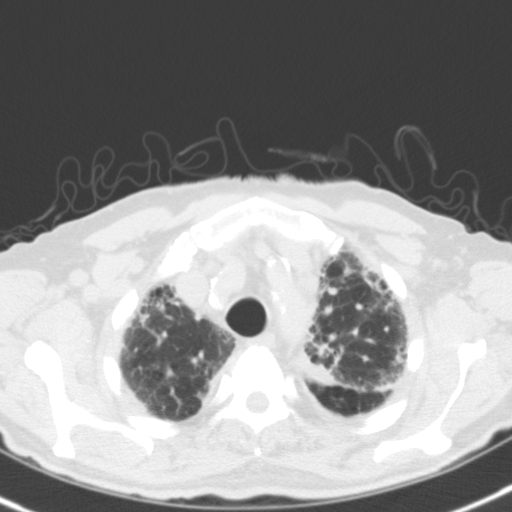
[im 96/111  lung]
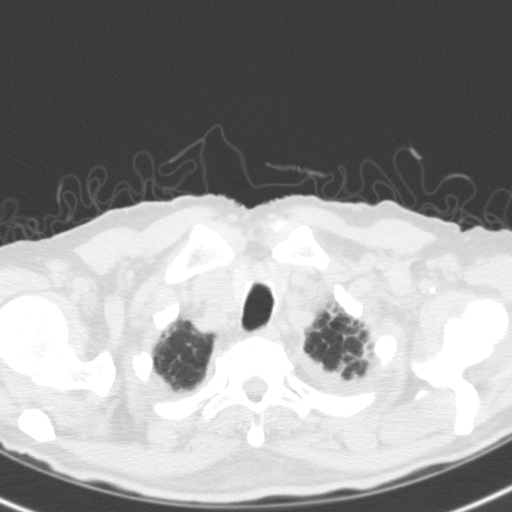
[im 106/111  lung]
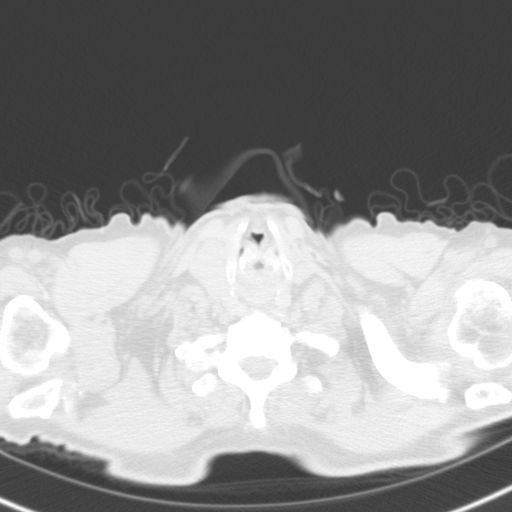

[Series 6: high res std 3.0 coronal · coronal · 0.65mm/px · 3 of 89 slices shown]
[im 18/89  lung]
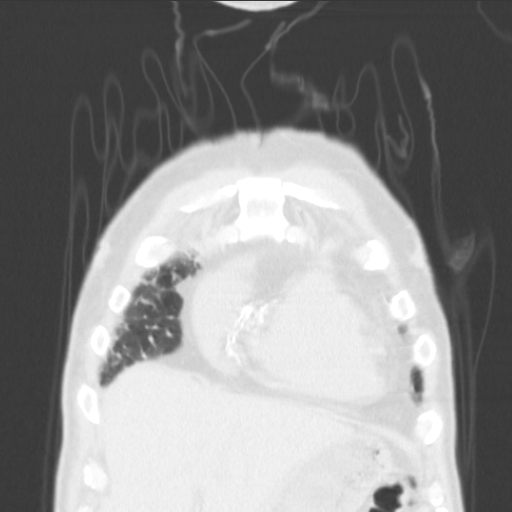
[im 36/89  lung]
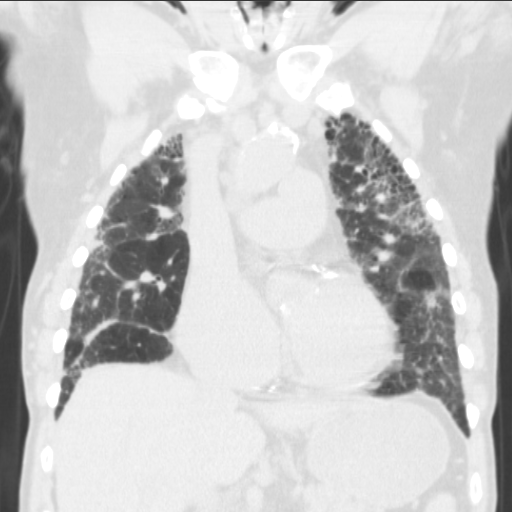
[im 53/89  lung]
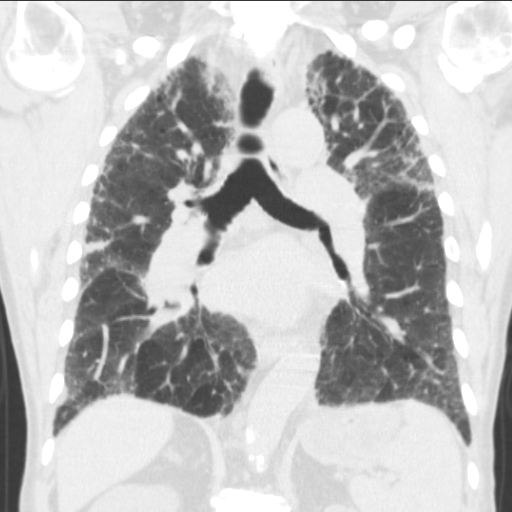

[15 of 36 positions shown; findings below may reference images not displayed]

FINDINGS: Mediastinum: Heart size is enlarged with marked dilatation of both
the right atrium and left atrium. There is no significant
pericardial fluid, thickening or pericardial calcification. There is
atherosclerosis of the thoracic aorta, the great vessels of the
mediastinum and the coronary arteries, including calcified
atherosclerotic plaque in the left main, left anterior descending,
left circumflex and right coronary arteries. Dilatation of the
pulmonic trunk (4.4 cm in diameter) and main pulmonary arteries
bilaterally, suggesting pulmonary arterial hypertension. Innumerable
borderline enlarged and enlarged mediastinal and bilateral hilar
lymph nodes, largest of which measure up to 1.3 cm in short axis in
the high right paratracheal station. Esophagus is unremarkable in
appearance.

Lungs/Pleura: High-resolution images demonstrate extensive
subpleural reticulation, parenchymal banding and frank honeycombing
throughout the lungs bilaterally, most pronounced throughout the
anterior aspects of the lungs and in the lung bases. Some areas of
associated mild cylindrical traction bronchiectasis are noted.
Inspiratory and expiratory imaging demonstrates evidence of air
trapping, suggesting some small airways disease. Small amount of
pleural thickening and/or fluid bilaterally dependently.

Upper Abdomen: Unremarkable.

Musculoskeletal: There are no aggressive appearing lytic or blastic
lesions noted in the visualized portions of the skeleton.
IMPRESSION: 1. The appearance of the chest is compatible with an underlying
interstitial lung disease. Based on the prevalence of honeycombing,
traction bronchiectasis and progression of findings compared to
prior CT scan 12/02/2011, this is strongly favored to represent
usual interstitial pneumonia (UIP).
2. Extensive bilateral pleural thickening versus trace bilateral
pleural effusions.
3. Numerous borderline enlarged and mildly enlarged mediastinal and
bilateral hilar lymph nodes are nonspecific, but are commonly seen
in the setting of underlying interstitial lung disease.
4. Cardiomegaly with marked biatrial dilatation.
5. Dilatation of the pulmonic trunk and main pulmonary arteries,
suggesting underlying pulmonary arterial hypertension.
6. Evidence of air trapping suggesting small airways disease.
7. Atherosclerosis, including left main and 3 vessel coronary artery
disease.

## 2013-12-29 DIAGNOSIS — R739 Hyperglycemia, unspecified: Secondary | ICD-10-CM | POA: Insufficient documentation

## 2013-12-29 NOTE — Assessment & Plan Note (Signed)
Rate controlled, tolerating Coumadin, managed by [facility]

## 2013-12-29 NOTE — Assessment & Plan Note (Signed)
Well controlled, no changes to meds. Encouraged heart healthy diet such as the DASH diet and exercise as tolerated.  °

## 2013-12-29 NOTE — Assessment & Plan Note (Signed)
No recent flares, maintain hydration

## 2013-12-29 NOTE — Assessment & Plan Note (Addendum)
Minimize simple carbs and increase activity as tolerated acceptable hgba1c

## 2014-01-18 ENCOUNTER — Telehealth: Payer: Self-pay | Admitting: Critical Care Medicine

## 2014-01-18 MED ORDER — PIRFENIDONE 267 MG PO CAPS: 3.0000 | ORAL_CAPSULE | Freq: Three times a day (TID) | ORAL | Status: DC

## 2014-01-18 NOTE — Telephone Encounter (Signed)
Called [facility] at [phone] and spoke with representative Nevin Bloodgood.  Verbal order given to continue pt's Esbriet at 267mg  TID w/ food #270 capsules with 6 add'l refills.  Pt's med list updated.  Will sign off.  Per 6.18.15 ov w/ PW: Patient Instructions     Labs today  No change esbriet 3 three times daily  Stop singulair  We will obtain breathing test and CT Scan in 4 months prior to next visit  Return 4 months

## 2014-01-24 ENCOUNTER — Other Ambulatory Visit: Payer: Self-pay | Admitting: Family Medicine

## 2014-02-08 ENCOUNTER — Other Ambulatory Visit: Payer: Self-pay | Admitting: Family Medicine

## 2014-02-21 ENCOUNTER — Other Ambulatory Visit: Payer: Self-pay | Admitting: Family Medicine

## 2014-02-21 NOTE — Telephone Encounter (Signed)
Rx sent to pharmacy. Pt is due for f/u 03/27/14. Please call to schedule. Thank  You LDM

## 2014-02-21 NOTE — Telephone Encounter (Signed)
Informed patient of this and he scheduled appointment for late september

## 2014-03-12 ENCOUNTER — Ambulatory Visit (INDEPENDENT_AMBULATORY_CARE_PROVIDER_SITE_OTHER): Payer: Medicare Other | Admitting: Critical Care Medicine

## 2014-03-12 DIAGNOSIS — J84112 Idiopathic pulmonary fibrosis: Secondary | ICD-10-CM

## 2014-03-12 DIAGNOSIS — R892 Abnormal level of other drugs, medicaments and biological substances in specimens from other organs, systems and tissues: Secondary | ICD-10-CM

## 2014-03-12 LAB — PULMONARY FUNCTION TEST
DL/VA % PRED: 78 %
DL/VA: 3.43 ml/min/mmHg/L
DLCO unc % pred: 38 %
DLCO unc: 10.95 ml/min/mmHg
FEF 25-75 POST: 3.21 L/s
FEF 25-75 PRE: 2.57 L/s
FEF2575-%Change-Post: 24 %
FEF2575-%PRED-POST: 205 %
FEF2575-%PRED-PRE: 164 %
FEV1-%Change-Post: 4 %
FEV1-%PRED-PRE: 72 %
FEV1-%Pred-Post: 75 %
FEV1-Post: 1.79 L
FEV1-Pre: 1.72 L
FEV1FVC-%Change-Post: 3 %
FEV1FVC-%Pred-Pre: 121 %
FEV6-%Change-Post: 0 %
FEV6-%PRED-POST: 63 %
FEV6-%Pred-Pre: 63 %
FEV6-POST: 2 L
FEV6-Pre: 1.99 L
FEV6FVC-%Pred-Post: 108 %
FEV6FVC-%Pred-Pre: 108 %
FVC-%CHANGE-POST: 0 %
FVC-%PRED-PRE: 58 %
FVC-%Pred-Post: 58 %
FVC-POST: 2 L
FVC-Pre: 1.99 L
PRE FEV1/FVC RATIO: 86 %
Post FEV1/FVC ratio: 89 %
Post FEV6/FVC ratio: 100 %
Pre FEV6/FVC Ratio: 100 %
RV % pred: 51 %
RV: 1.32 L
TLC % pred: 54 %
TLC: 3.52 L

## 2014-03-12 NOTE — Progress Notes (Signed)
PFT done today. 

## 2014-03-14 NOTE — Progress Notes (Signed)
Quick Note:  Called, spoke with pt. Informed him of PFT results per Dr. Joya Gaskins. He verbalized understanding and voiced no further questions or concerns at this time. ______

## 2014-03-26 ENCOUNTER — Ambulatory Visit (INDEPENDENT_AMBULATORY_CARE_PROVIDER_SITE_OTHER): Payer: Medicare Other | Admitting: Family Medicine

## 2014-03-26 ENCOUNTER — Encounter: Payer: Self-pay | Admitting: Family Medicine

## 2014-03-26 VITALS — BP 118/80 | HR 76 | Temp 97.7°F | Ht 67.0 in | Wt 159.6 lb

## 2014-03-26 DIAGNOSIS — J9610 Chronic respiratory failure, unspecified whether with hypoxia or hypercapnia: Secondary | ICD-10-CM

## 2014-03-26 DIAGNOSIS — R7309 Other abnormal glucose: Secondary | ICD-10-CM

## 2014-03-26 DIAGNOSIS — R0902 Hypoxemia: Secondary | ICD-10-CM

## 2014-03-26 DIAGNOSIS — M109 Gout, unspecified: Secondary | ICD-10-CM

## 2014-03-26 DIAGNOSIS — D582 Other hemoglobinopathies: Secondary | ICD-10-CM

## 2014-03-26 DIAGNOSIS — Z23 Encounter for immunization: Secondary | ICD-10-CM

## 2014-03-26 DIAGNOSIS — I10 Essential (primary) hypertension: Secondary | ICD-10-CM

## 2014-03-26 DIAGNOSIS — R739 Hyperglycemia, unspecified: Secondary | ICD-10-CM

## 2014-03-26 DIAGNOSIS — E785 Hyperlipidemia, unspecified: Secondary | ICD-10-CM

## 2014-03-26 DIAGNOSIS — J9611 Chronic respiratory failure with hypoxia: Secondary | ICD-10-CM

## 2014-03-26 LAB — RENAL FUNCTION PANEL
ALBUMIN: 3.8 g/dL (ref 3.5–5.2)
BUN: 45 mg/dL — ABNORMAL HIGH (ref 6–23)
CHLORIDE: 102 meq/L (ref 96–112)
CO2: 26 mEq/L (ref 19–32)
Calcium: 9.3 mg/dL (ref 8.4–10.5)
Creatinine, Ser: 1.3 mg/dL (ref 0.4–1.5)
GFR: 58.1 mL/min — ABNORMAL LOW (ref >60.00)
Glucose, Bld: 99 mg/dL (ref 70–99)
POTASSIUM: 4.2 meq/L (ref 3.5–5.1)
Phosphorus: 3.1 mg/dL (ref 2.3–4.6)
Sodium: 138 mEq/L (ref 135–145)

## 2014-03-26 LAB — CBC
HCT: 53.8 % — ABNORMAL HIGH (ref 39.0–52.0)
HEMOGLOBIN: 17.1 g/dL — AB (ref 13.0–17.0)
MCHC: 31.8 g/dL (ref 30.0–36.0)
MCV: 93.2 fl (ref 78.0–100.0)
Platelets: 143 10*3/uL — ABNORMAL LOW (ref 150.0–400.0)
RBC: 5.77 Mil/uL (ref 4.22–5.81)
RDW: 17 % — ABNORMAL HIGH (ref 11.5–15.5)
WBC: 9.7 10*3/uL (ref 4.0–10.5)

## 2014-03-26 LAB — HEMOGLOBIN A1C: Hgb A1c MFr Bld: 6.5 % (ref 4.6–6.5)

## 2014-03-26 LAB — HEPATIC FUNCTION PANEL
ALK PHOS: 71 U/L (ref 39–117)
ALT: 20 U/L (ref 0–53)
AST: 20 U/L (ref 0–37)
Albumin: 3.8 g/dL (ref 3.5–5.2)
BILIRUBIN DIRECT: 0.2 mg/dL (ref 0.0–0.3)
Total Bilirubin: 1.1 mg/dL (ref 0.2–1.2)
Total Protein: 6.4 g/dL (ref 6.0–8.3)

## 2014-03-26 LAB — LIPID PANEL
CHOL/HDL RATIO: 6
Cholesterol: 227 mg/dL — ABNORMAL HIGH (ref 0–200)
HDL: 38.5 mg/dL — ABNORMAL LOW (ref >39.00)
LDL CALC: 149 mg/dL — AB (ref 0–99)
NONHDL: 188.5
Triglycerides: 197 mg/dL — ABNORMAL HIGH (ref 0.0–149.0)
VLDL: 39.4 mg/dL (ref 0.0–40.0)

## 2014-03-26 LAB — TSH: TSH: 1.88 u[IU]/mL (ref 0.35–4.50)

## 2014-03-26 MED ORDER — DIGOXIN 250 MCG PO TABS: 0.2500 mg | ORAL_TABLET | Freq: Every day | ORAL | Status: AC

## 2014-03-26 NOTE — Patient Instructions (Signed)
Contour pillow, Bed Bath and [employer] patches as needed   Bursitis Bursitis is a swelling and soreness (inflammation) of a fluid-filled sac (bursa) that overlies and protects a joint. It can be caused by injury, overuse of the joint, arthritis or infection. The joints most likely to be affected are the elbows, shoulders, hips and knees. HOME CARE INSTRUCTIONS   Apply ice to the affected area for 15-20 minutes each hour while awake for 2 days. Put the ice in a plastic bag and place a towel between the bag of ice and your skin.  Rest the injured joint as much as possible, but continue to put the joint through a full range of motion, 4 times per day. (The shoulder joint especially becomes rapidly "frozen" if not used.) When the pain lessens, begin normal slow movements and usual activities.  Only take over-the-counter or prescription medicines for pain, discomfort or fever as directed by your caregiver.  Your caregiver may recommend draining the bursa and injecting medicine into the bursa. This may help the healing process.  Follow all instructions for follow-up with your caregiver. This includes any orthopedic referrals, physical therapy and rehabilitation. Any delay in obtaining necessary care could result in a delay or failure of the bursitis to heal and chronic pain. SEEK IMMEDIATE MEDICAL CARE IF:   Your pain increases even during treatment.  You develop an oral temperature above 102 F (38.9 C) and have heat and inflammation over the involved bursa. MAKE SURE YOU:   Understand these instructions.  Will watch your condition.  Will get help right away if you are not doing well or get worse. Document Released: 06/11/2000 Document Revised: 09/06/2011 Document Reviewed: 09/03/2013 [facility] Patient Information 2015 [place], [place]. This information is not intended to replace advice given to you by your health care provider. Make sure you discuss any questions you have with your  health care provider.

## 2014-03-26 NOTE — Progress Notes (Signed)
Pre visit review using our clinic review tool, if applicable. No additional management support is needed unless otherwise documented below in the visit note. 

## 2014-03-27 MED ORDER — ATORVASTATIN CALCIUM 40 MG PO TABS: ORAL_TABLET | ORAL | Status: DC

## 2014-03-27 NOTE — Assessment & Plan Note (Signed)
hgba1c acceptable, minimize simple carbs. Increase exercise as tolerated.  

## 2014-03-27 NOTE — Assessment & Plan Note (Signed)
Well controlled, no changes to meds. Encouraged heart healthy diet such as the DASH diet and exercise as tolerated.  °

## 2014-03-27 NOTE — Assessment & Plan Note (Signed)
Tolerating statin, encouraged heart healthy diet, avoid trans fats, minimize simple carbs and saturated fats. Increase exercise as tolerated 

## 2014-03-27 NOTE — Assessment & Plan Note (Signed)
Uses O2 routinely

## 2014-03-27 NOTE — Progress Notes (Signed)
Patient ID: Jordan Bautista, male   DOB: 1928/09/05, 80 y.o.   MRN: [id number] HARRELL NIEHOFF [id number] [birth year] 03/27/2014      Progress Note-Follow Up  Subjective  Chief Complaint  Chief Complaint  Patient presents with  . Medication Refill  . Injections    flu    HPI  Patient is a 80 year old male in today for routine medical care. Patient is in today for followup. Is struggling with some shoulder pain recently he notes it is worse person in the morning after sleeping. He tends to sleep on his side. No recent injury or fall. Wears oxygen routinely secondary to hypoxia and is winded easily but not at rest. Denies CP/palp/HA/congestion/fevers/GI or GU c/o. Taking meds as prescribed  Past Medical History  Diagnosis Date  . Atrial fibrillation   . High cholesterol   . Hypertension   . Skin cancer   . Heart failure   . Gout 08/20/2012  . Dehydration 02/06/2013  . HTN (hypertension) 05/13/2013    Past Surgical History  Procedure Laterality Date  . Basal cell carcinoma excision  2013    Family History  Problem Relation Age of Onset  . Prostate cancer Neg Hx   . Colon cancer Neg Hx   . Breast cancer Mother   . Hypertension Neg Hx   . Heart disease Neg Hx   . Diabetes Son     2 sons    History   Social History  . Marital Status: Married    Spouse Name: N/A    Number of Children: N/A  . Years of Education: N/A   Occupational History  . retired     Psychologist, educational    Social History Main Topics  . Smoking status: Former Smoker -- 1.00 packs/day for 30 years    Types: Cigarettes, Pipe, Cigars    Quit date: 06/28/1978  . Smokeless tobacco: Never Used  . Alcohol Use: Yes  . Drug Use: No  . Sexual Activity: Not on file   Other Topics Concern  . Not on file   Social History Narrative  . No narrative on file    Current Outpatient Prescriptions on File Prior to Visit  Medication Sig Dispense Refill  . allopurinol (ZYLOPRIM) 300 MG tablet TAKE ONE (1) TABLET  BY MOUTH EVERY DAY  30 tablet  1  . aspirin 81 MG tablet Take 81 mg by mouth daily.      Marland Kitchen desonide (DESOWEN) 0.05 % ointment Apply 1 application topically 2 (two) times daily.      . Flaxseed, Linseed, (FLAXSEED OIL) 1000 MG CAPS 1 cap twice a day    0  . furosemide (LASIX) 40 MG tablet TAKE ONE (1) TABLET BY MOUTH EVERY DAY  30 tablet  2  . hydrOXYzine (ATARAX/VISTARIL) 25 MG tablet Take 1/2 to 1 T po qhs for 3 weeks, then QHS PRN  30 tablet  3  . isosorbide dinitrate (ISORDIL) 30 MG tablet Take 30 mg by mouth daily.      Marland Kitchen loratadine (CLARITIN) 10 MG tablet Take 1 tablet (10 mg total) by mouth daily.  30 tablet  1  . metoprolol succinate (TOPROL-XL) 100 MG 24 hr tablet TAKE ONE (1) TABLET EACH DAY  30 tablet  2  . nystatin-triamcinolone ointment (MYCOLOG) Apply 1 application topically 2 (two) times daily. Use on hands  60 g  2  . Olopatadine HCl 0.2 % SOLN Apply 1 drop to eye daily as needed.      Marland Kitchen  Pirfenidone 267 MG CAPS Take 3 capsules by mouth 3 (three) times daily with meals.  270 capsule  6  . potassium chloride SA (K-DUR,KLOR-CON) 20 MEQ tablet TAKE ONE (1) TABLET BY MOUTH EVERY DAY  30 tablet  1  . tobramycin-dexamethasone (TOBRADEX) ophthalmic ointment Place 1 application into both eyes 3 (three) times daily as needed.       . triamcinolone (NASACORT AQ) 55 MCG/ACT AERO nasal inhaler USE 1 SPRAY IN EACH NOSTRIL DAILY as needed      . triamcinolone ointment (KENALOG) 0.1 % Apply 1 application topically 2 (two) times daily.      Marland Kitchen warfarin (COUMADIN) 5 MG tablet 7.5 mg on Sun, Weds 5 mg on Sat, Mon, Tues, Thurs, Fri As directed       No current facility-administered medications on file prior to visit.    No Known Allergies  Review of Systems  Review of Systems  Constitutional: Negative for fever and malaise/fatigue.  HENT: Negative for congestion.   Eyes: Negative for discharge.  Respiratory: Negative for shortness of breath.   Cardiovascular: Negative for chest pain,  palpitations and leg swelling.  Gastrointestinal: Negative for nausea, abdominal pain and diarrhea.  Genitourinary: Negative for dysuria.  Musculoskeletal: Negative for falls.  Skin: Negative for rash.  Neurological: Negative for loss of consciousness and headaches.  Endo/Heme/Allergies: Negative for polydipsia.  Psychiatric/Behavioral: Negative for depression and suicidal ideas. The patient is not nervous/anxious and does not have insomnia.     Objective  BP 118/80  Pulse 76  Temp(Src) 97.7 F (36.5 C) (Oral)  Ht 5\' 7"  (1.702 m)  Wt 159 lb 9.6 oz (72.394 kg)  BMI 24.99 kg/m2  SpO2 85%  Physical Exam  Physical Exam  Constitutional: He is oriented to person, place, and time and well-developed, well-nourished, and in no distress. No distress.  HENT:  Head: Normocephalic and atraumatic.  Eyes: Conjunctivae are normal.  Neck: Neck supple. No thyromegaly present.  Cardiovascular: Normal rate, regular rhythm and normal heart sounds.   No murmur heard. Pulmonary/Chest: Effort normal and breath sounds normal. No respiratory distress.  O2 via [place]  Abdominal: He exhibits no distension and no mass. There is no tenderness.  Musculoskeletal: He exhibits no edema.  Neurological: He is alert and oriented to person, place, and time.  Skin: Skin is warm.  Psychiatric: Memory, affect and judgment normal.    Lab Results  Component Value Date   TSH 1.88 03/26/2014   Lab Results  Component Value Date   WBC 9.7 03/26/2014   HGB 17.1* 03/26/2014   HCT 53.8* 03/26/2014   MCV 93.2 03/26/2014   PLT 143.0* 03/26/2014   Lab Results  Component Value Date   CREATININE 1.3 03/26/2014   BUN 45* 03/26/2014   NA 138 03/26/2014   K 4.2 03/26/2014   CL 102 03/26/2014   CO2 26 03/26/2014   Lab Results  Component Value Date   ALT 20 03/26/2014   AST 20 03/26/2014   ALKPHOS 71 03/26/2014   BILITOT 1.1 03/26/2014   Lab Results  Component Value Date   CHOL 227* 03/26/2014   Lab Results  Component Value  Date   HDL 38.50* 03/26/2014   Lab Results  Component Value Date   LDLCALC 149* 03/26/2014   Lab Results  Component Value Date   TRIG 197.0* 03/26/2014   Lab Results  Component Value Date   CHOLHDL 6 03/26/2014     Assessment & Plan  HTN (hypertension) Well controlled, no changes to  meds. Encouraged heart healthy diet such as the DASH diet and exercise as tolerated.   Hyperlipidemia Tolerating statin, encouraged heart healthy diet, avoid trans fats, minimize simple carbs and saturated fats. Increase exercise as tolerated  Chronic respiratory failure with hypoxia Uses O2 routinely  Hyperglycemia hgba1c acceptable, minimize simple carbs. Increase exercise as tolerated.   Gout Well controlled on Allopurinol

## 2014-03-27 NOTE — Assessment & Plan Note (Signed)
Well controlled on Allopurinol

## 2014-03-28 ENCOUNTER — Other Ambulatory Visit: Payer: Self-pay | Admitting: Family Medicine

## 2014-04-11 ENCOUNTER — Other Ambulatory Visit: Payer: Self-pay | Admitting: Family Medicine

## 2014-04-11 ENCOUNTER — Encounter: Payer: Self-pay | Admitting: Critical Care Medicine

## 2014-04-11 ENCOUNTER — Ambulatory Visit (INDEPENDENT_AMBULATORY_CARE_PROVIDER_SITE_OTHER): Payer: Medicare Other | Admitting: Critical Care Medicine

## 2014-04-11 VITALS — BP 146/88 | HR 85 | Temp 97.7°F | Ht 67.0 in | Wt 158.0 lb

## 2014-04-11 DIAGNOSIS — J84112 Idiopathic pulmonary fibrosis: Secondary | ICD-10-CM

## 2014-04-11 DIAGNOSIS — Z23 Encounter for immunization: Secondary | ICD-10-CM

## 2014-04-11 DIAGNOSIS — J9611 Chronic respiratory failure with hypoxia: Secondary | ICD-10-CM

## 2014-04-11 MED ORDER — PIRFENIDONE 267 MG PO CAPS: ORAL_CAPSULE | ORAL | Status: DC

## 2014-04-11 NOTE — Progress Notes (Signed)
Subjective:    Patient ID: Jordan Bautista, male    DOB: 08-19-1928, 80 y.o.   MRN: [id number]  HPI  04/11/2014 Chief Complaint  Patient presents with  . Follow-up    Pt states he feels that his SOB is some worse since last OV in June.  Pt CT scan is set for 04-25-14.   Overall the patient notes his dyspnea is worse with exertion. The patient has a portable oxygen conserving device. There is a persistent rash. The patient has seen dermatology who feels this is a drug eruption. There has been a question of allergies to beta blockers in the past.  The patient has a cough which is chronic. There is no chest pain.    Review of Systems  Constitutional: Negative for chills, diaphoresis, activity change, appetite change, fatigue and unexpected weight change.  HENT: Negative for congestion, dental problem, ear discharge, facial swelling, hearing loss, mouth sores, nosebleeds, postnasal drip, sinus pressure, sneezing, tinnitus, trouble swallowing and voice change.   Eyes: Negative for photophobia, discharge, itching and visual disturbance.  Respiratory: Positive for shortness of breath. Negative for apnea, cough, choking, chest tightness and stridor.   Cardiovascular: Negative for palpitations.  Gastrointestinal: Negative for nausea, constipation, blood in stool and abdominal distention.  Genitourinary: Negative for dysuria, urgency, frequency, hematuria, flank pain, decreased urine volume and difficulty urinating.  Musculoskeletal: Negative for arthralgias, back pain, gait problem, joint swelling, myalgias and neck stiffness.  Skin: Positive for rash. Negative for color change and pallor.  Neurological: Negative for dizziness, tremors, seizures, syncope, speech difficulty, weakness, light-headedness, numbness and headaches.  Hematological: Negative for adenopathy. Bruises/bleeds easily.  Psychiatric/Behavioral: Negative for confusion, sleep disturbance and agitation. The patient is not  nervous/anxious.        Objective:   Physical Exam  Filed Vitals:   04/11/14 1037  BP: 146/88  Pulse: 85  Temp: 97.7 F (36.5 C)  TempSrc: Oral  Height: 5\' 7"  (1.702 m)  Weight: 158 lb (71.668 kg)  SpO2: 89%    Gen: Pleasant, well-nourished, in no distress,  normal affect  ENT: No lesions,  mouth clear,  oropharynx clear, no postnasal drip  Neck: 1+  JVD, no TMG, no carotid bruits  Lungs: No use of accessory muscles, no dullness to percussion, bibasilar dry rales  Cardiovascular: RRR, split P2 otherwise  heart sounds normal, no murmur or gallops, no  peripheral edema  Abdomen: soft and NT, no HSM,  BS normal  Musculoskeletal: No deformities, no cyanosis or clubbing  Neuro: alert, non focal  Skin: Warm, no lesions or rashes   No results found.    Assessment & Plan:   IPF (idiopathic pulmonary fibrosis) Idiopathic pulmonary fibrosis with probable progression despite pirfenidone Rash and probable allergic reaction to pirfenidone Plan Hold pirfenidone Prevnar 13 was administered  Chronic respiratory failure with hypoxia Chronic respiratory failure with associated hypoxemia The patient will need continuous flow portable oxygen system as he is unable to tolerate portable oxygen conserving device    Updated Medication List Outpatient Encounter Prescriptions as of 04/11/2014  Medication Sig  . aspirin 81 MG tablet Take 81 mg by mouth daily.  Marland Kitchen atorvastatin (LIPITOR) 40 MG tablet take 1 and 1/2 tablet daily -60 mg total.  . desonide (DESOWEN) 0.05 % ointment Apply 1 application topically 2 (two) times daily.  . digoxin (DIGOX) 0.25 MG tablet Take 1 tablet (0.25 mg total) by mouth daily.  . Flaxseed, Linseed, (FLAXSEED OIL) 1000 MG CAPS 1 cap twice a day  .  furosemide (LASIX) 40 MG tablet TAKE ONE (1) TABLET BY MOUTH EVERY DAY  . hydrOXYzine (ATARAX/VISTARIL) 25 MG tablet Take 1/2 to 1 T po qhs for 3 weeks, then QHS PRN  . isosorbide dinitrate (ISORDIL) 30 MG  tablet TAKE 1 TABLET FOUR TIMES A DAY  . loratadine (CLARITIN) 10 MG tablet Take 1 tablet (10 mg total) by mouth daily.  . metoprolol succinate (TOPROL-XL) 100 MG 24 hr tablet TAKE ONE (1) TABLET EACH DAY  . nystatin-triamcinolone ointment (MYCOLOG) Apply 1 application topically 2 (two) times daily. Use on hands  . Olopatadine HCl 0.2 % SOLN Apply 1 drop to eye daily as needed.  . Pirfenidone 267 MG CAPS HOLD for two weeks then call with rash status  . tobramycin-dexamethasone (TOBRADEX) ophthalmic ointment Place 1 application into both eyes 3 (three) times daily as needed.   . triamcinolone (NASACORT AQ) 55 MCG/ACT AERO nasal inhaler USE 1 SPRAY IN EACH NOSTRIL DAILY as needed  . triamcinolone ointment (KENALOG) 0.1 % Apply 1 application topically 2 (two) times daily.  Marland Kitchen warfarin (COUMADIN) 5 MG tablet 7.5 mg on Sun, Weds 5 mg on Sat, Mon, Tues, Thurs, Fri As directed  . [DISCONTINUED] allopurinol (ZYLOPRIM) 300 MG tablet TAKE ONE (1) TABLET BY MOUTH EVERY DAY  . [DISCONTINUED] Pirfenidone 267 MG CAPS Take 3 capsules by mouth 3 (three) times daily with meals.  . [DISCONTINUED] potassium chloride SA (K-DUR,KLOR-CON) 20 MEQ tablet TAKE ONE (1) TABLET BY MOUTH EVERY DAY

## 2014-04-11 NOTE — Patient Instructions (Addendum)
Prevnar 13 was given Stay on oxygen: 3 liters rest, we need to get a portable oxygen system that delivers continuous oxygen Stop pirfenidone for two weeks, call us with rash status No other changes Return 1 month

## 2014-04-12 NOTE — Assessment & Plan Note (Signed)
Chronic respiratory failure with associated hypoxemia The patient will need continuous flow portable oxygen system as he is unable to tolerate portable oxygen conserving device

## 2014-04-12 NOTE — Assessment & Plan Note (Addendum)
Idiopathic pulmonary fibrosis with probable progression despite pirfenidone Rash and probable allergic reaction to pirfenidone Plan Hold pirfenidone Prevnar 13 was administered

## 2014-04-25 ENCOUNTER — Ambulatory Visit (HOSPITAL_BASED_OUTPATIENT_CLINIC_OR_DEPARTMENT_OTHER)
Admission: RE | Admit: 2014-04-25 | Discharge: 2014-04-25 | Disposition: A | Discharge status: Home / Self Care | Payer: Medicare Other | Source: Ambulatory Visit | Attending: Critical Care Medicine | Admitting: Critical Care Medicine

## 2014-04-25 ENCOUNTER — Telehealth: Payer: Self-pay | Admitting: Critical Care Medicine

## 2014-04-25 DIAGNOSIS — I272 Other secondary pulmonary hypertension: Secondary | ICD-10-CM | POA: Diagnosis not present

## 2014-04-25 DIAGNOSIS — T50901D Poisoning by unspecified drugs, medicaments and biological substances, accidental (unintentional), subsequent encounter: Secondary | ICD-10-CM | POA: Diagnosis present

## 2014-04-25 DIAGNOSIS — R0602 Shortness of breath: Secondary | ICD-10-CM | POA: Diagnosis present

## 2014-04-25 DIAGNOSIS — I700 Atherosclerosis of aorta: Secondary | ICD-10-CM | POA: Insufficient documentation

## 2014-04-25 DIAGNOSIS — R892 Abnormal level of other drugs, medicaments and biological substances in specimens from other organs, systems and tissues: Secondary | ICD-10-CM

## 2014-04-25 DIAGNOSIS — J84112 Idiopathic pulmonary fibrosis: Secondary | ICD-10-CM | POA: Diagnosis present

## 2014-04-25 NOTE — Telephone Encounter (Signed)
This means rash is NOT due to pirfenidone  I suspect is due to metoprolol  He needs to call pcp re this  Ok to resume pirfenidone

## 2014-04-25 NOTE — Telephone Encounter (Signed)
Called and spoke to pt. Pt calling with update of rash since last OV on 04/11/14. Pt stated it has not changed in appearance but feels like it has changed in location. Pt states the rash is on BIL arms, upper chest and center of back. Pt denies any other change in SOB or symptoms. Pt stated the rash does itch and is using Kenalog cream every 5-6 hours to help relieve the itch. Pt aware to await PW's recs before changing medications.    Patient Instructions     Prevnar 13 was given  Stay on oxygen: 3 liters rest, we need to get a portable oxygen system that delivers continuous oxygen  Stop pirfenidone for two weeks, call us with rash status  No other changes  Return 1 month      PW please advise.

## 2014-04-25 NOTE — Telephone Encounter (Signed)
Called and spoke to pt. Informed pt of recs per PW. Pt verbalized understanding and stated he would resume the Pirfenidone and contact PCP about Metoprolol. Nothing further needed at this time.

## 2014-04-30 ENCOUNTER — Other Ambulatory Visit (INDEPENDENT_AMBULATORY_CARE_PROVIDER_SITE_OTHER): Payer: Medicare Other

## 2014-04-30 DIAGNOSIS — D582 Other hemoglobinopathies: Secondary | ICD-10-CM

## 2014-04-30 LAB — CBC
HCT: 53.9 % — ABNORMAL HIGH (ref 39.0–52.0)
HEMOGLOBIN: 17.1 g/dL — AB (ref 13.0–17.0)
MCHC: 31.8 g/dL (ref 30.0–36.0)
MCV: 93.3 fl (ref 78.0–100.0)
Platelets: 162 10*3/uL (ref 150.0–400.0)
RBC: 5.77 Mil/uL (ref 4.22–5.81)
RDW: 18 % — ABNORMAL HIGH (ref 11.5–15.5)
WBC: 10.7 10*3/uL — ABNORMAL HIGH (ref 4.0–10.5)

## 2014-05-09 ENCOUNTER — Encounter: Payer: Self-pay | Admitting: Critical Care Medicine

## 2014-05-09 ENCOUNTER — Ambulatory Visit (INDEPENDENT_AMBULATORY_CARE_PROVIDER_SITE_OTHER): Payer: Medicare Other | Admitting: Critical Care Medicine

## 2014-05-09 VITALS — BP 132/68 | HR 69 | Ht 67.0 in | Wt 159.0 lb

## 2014-05-09 DIAGNOSIS — J84112 Idiopathic pulmonary fibrosis: Secondary | ICD-10-CM

## 2014-05-09 DIAGNOSIS — L309 Dermatitis, unspecified: Secondary | ICD-10-CM

## 2014-05-09 DIAGNOSIS — I5032 Chronic diastolic (congestive) heart failure: Secondary | ICD-10-CM

## 2014-05-09 MED ORDER — ISOSORBIDE DINITRATE 30 MG PO TABS: ORAL_TABLET | ORAL | Status: AC

## 2014-05-09 NOTE — Progress Notes (Signed)
Subjective:    Patient ID: Jordan Bautista, male    DOB: 03-08-1929, 80 y.o.   MRN: [id number]  HPI  05/09/2014 Chief Complaint  Patient presents with  . Follow-up    Pt states he is doing well today.  No breathing complaints.    Rash is better.  Pt spoke to cardiology:  Pt has taken off the lipitor:  Off x 1 week, and is better.   Now back on pirfenidone, did not think this was cause for rash.  Did have more congestion one week ago, now is better. No chest pain.      Review of Systems  Constitutional: Negative for chills, diaphoresis, activity change, appetite change, fatigue and unexpected weight change.  HENT: Negative for congestion, dental problem, ear discharge, facial swelling, hearing loss, mouth sores, nosebleeds, postnasal drip, sinus pressure, sneezing, tinnitus, trouble swallowing and voice change.   Eyes: Negative for photophobia, discharge, itching and visual disturbance.  Respiratory: Positive for shortness of breath. Negative for apnea, cough, choking, chest tightness and stridor.   Cardiovascular: Negative for palpitations.  Gastrointestinal: Negative for nausea, constipation, blood in stool and abdominal distention.  Genitourinary: Negative for dysuria, urgency, frequency, hematuria, flank pain, decreased urine volume and difficulty urinating.  Musculoskeletal: Negative for myalgias, back pain, joint swelling, arthralgias, gait problem and neck stiffness.  Skin: Positive for rash. Negative for color change and pallor.  Neurological: Negative for dizziness, tremors, seizures, syncope, speech difficulty, weakness, light-headedness, numbness and headaches.  Hematological: Negative for adenopathy. Bruises/bleeds easily.  Psychiatric/Behavioral: Negative for confusion, sleep disturbance and agitation. The patient is not nervous/anxious.        Objective:   Physical Exam Filed Vitals:   05/09/14 0956  BP: 132/68  Pulse: 69  Height: 5\' 7"  (1.702 m)  Weight: 159 lb  (72.122 kg)  SpO2: 95%    Gen: Pleasant, well-nourished, in no distress,  normal affect  ENT: No lesions,  mouth clear,  oropharynx clear, no postnasal drip  Neck: 1+  JVD, no TMG, no carotid bruits  Lungs: No use of accessory muscles, no dullness to percussion, bibasilar dry rales  Cardiovascular: RRR, split P2 otherwise  heart sounds normal, no murmur or gallops, no  peripheral edema  Abdomen: soft and NT, no HSM,  BS normal  Musculoskeletal: No deformities, no cyanosis or clubbing  Neuro: alert, non focal  Skin: Warm, no lesions or rashes   No results found.    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 Also better off lipitor NOT d/t pirfenidone     Updated Medication List Outpatient Encounter Prescriptions as of 05/09/2014  Medication Sig  . allopurinol (ZYLOPRIM) 300 MG tablet TAKE ONE (1) TABLET BY MOUTH EVERY DAY  . aspirin 81 MG tablet Take 81 mg by mouth daily.  Marland Kitchen desonide (DESOWEN) 0.05 % ointment Apply 1 application topically 2 (two) times daily.  . digoxin (DIGOX) 0.25 MG tablet Take 1 tablet (0.25 mg total) by mouth daily.  . Flaxseed, Linseed, (FLAXSEED OIL) 1000 MG CAPS 1 cap twice a day  . furosemide (LASIX) 40 MG tablet TAKE ONE (1) TABLET BY MOUTH EVERY DAY  .  hydrOXYzine (ATARAX/VISTARIL) 25 MG tablet Take 1/2 to 1 T po qhs for 3 weeks, then QHS PRN  . isosorbide dinitrate (ISORDIL) 30 MG tablet TAKE 1 TABLET once daily  . metoprolol succinate (TOPROL-XL) 100 MG 24 hr tablet TAKE ONE (1) TABLET EACH DAY  . nystatin-triamcinolone  ointment (MYCOLOG) Apply 1 application topically 2 (two) times daily. Use on hands  . Olopatadine HCl 0.2 % SOLN Apply 1 drop to eye daily as needed.  . Pirfenidone 267 MG CAPS 3 tablets TID  . potassium chloride SA (K-DUR,KLOR-CON) 20 MEQ tablet TAKE ONE (1) TABLET BY MOUTH EVERY DAY  . tobramycin-dexamethasone (TOBRADEX) ophthalmic ointment Place 1 application into both eyes 3 (three) times daily as needed.   . triamcinolone (NASACORT AQ) 55 MCG/ACT AERO nasal inhaler USE 1 SPRAY IN EACH NOSTRIL DAILY as needed  . triamcinolone ointment (KENALOG) 0.1 % Apply 1 application topically 2 (two) times daily.  Marland Kitchen warfarin (COUMADIN) 5 MG tablet 7.5 mg on Sun, Weds 5 mg on Sat, Mon, Tues, Thurs, Fri As directed  . [DISCONTINUED] isosorbide dinitrate (ISORDIL) 30 MG tablet TAKE 1 TABLET FOUR TIMES A DAY  . [DISCONTINUED] loratadine (CLARITIN) 10 MG tablet Take 1 tablet (10 mg total) by mouth daily.  Marland Kitchen atorvastatin (LIPITOR) 40 MG tablet take 1 and 1/2 tablet daily -60 mg total.

## 2014-05-09 NOTE — Patient Instructions (Signed)
No change in medications. Return in         4 months 

## 2014-05-10 NOTE — Assessment & Plan Note (Signed)
Dramatic improvement in lichenification on back & arms from recent treatments: switched to [facility] bar soap no more than 3 times weekly, claritin day, hydroxyzine pm (scratching less), 1 round keflex, daily lubricants (vaseline, coconut oil, lotions). Also better off lipitor NOT d/t pirfenidone

## 2014-05-10 NOTE — Assessment & Plan Note (Signed)
Idiopathic pulm fibrosis with chronic hypoxemic resp failure No evidence for pirfenidone allergy Rash d/t lipitor Plan Cont pirfenidone Cont oxygen

## 2014-06-07 ENCOUNTER — Other Ambulatory Visit: Payer: Self-pay | Admitting: Family Medicine

## 2014-06-07 NOTE — Telephone Encounter (Signed)
Allopurinol and potassium chloride refilled per protocol. JG//CMA

## 2014-06-26 ENCOUNTER — Other Ambulatory Visit: Payer: Self-pay | Admitting: Family Medicine

## 2014-06-27 NOTE — Telephone Encounter (Signed)
Rx sent to the pharmacy by e-script.//AB/CMA 

## 2014-08-16 ENCOUNTER — Telehealth: Payer: Self-pay

## 2014-08-16 NOTE — Telephone Encounter (Signed)
PA form received and filled out.  Needs a signature from PW before this can be faxed.  Cannot do PA over the phone.    Placed completed form in PW's look-at.  Will route this message to [name] to follow up on.

## 2014-08-16 NOTE — Telephone Encounter (Signed)
Received a form to initiate PA for Esbriet which will expire on 09/06/14.  Called [phone] to initiate this PA.  These forms are being faxed to our office today.  Will look out for these forms and fill out accordingly.   After approval we are to call Glory Buff ([facility] PA rep) to make him aware of approval at [phone] ext. [phone].

## 2014-08-19 NOTE — Telephone Encounter (Signed)
Form completed and faxed to [phone] .  In [name]'s box for F/U

## 2014-08-20 ENCOUNTER — Other Ambulatory Visit: Payer: Self-pay | Admitting: Emergency Medicine

## 2014-08-20 MED ORDER — PIRFENIDONE 267 MG PO CAPS: 3.0000 | ORAL_CAPSULE | Freq: Three times a day (TID) | ORAL | Status: AC

## 2014-08-27 NOTE — Telephone Encounter (Signed)
Called [facility] at [phone] and spoke to pharmacist. Pt's Jordan Bautista has been approved till 08/20/2015 and pt has current refills. Pt will be contact on 3/10 by [facility] to coordinate another shipment to pt's home. Nothing further needed at this time.

## 2014-08-28 ENCOUNTER — Other Ambulatory Visit: Payer: Self-pay | Admitting: Family Medicine

## 2014-08-28 NOTE — Telephone Encounter (Addendum)
Last filled:  03/28/14 Amt: 30, 4 refills Last OV:  03/26/14 Next appt: 09/03/14  Med filled x 1 month.

## 2014-09-02 ENCOUNTER — Encounter: Payer: Self-pay | Admitting: *Deleted

## 2014-09-02 ENCOUNTER — Telehealth: Payer: Self-pay | Admitting: *Deleted

## 2014-09-02 NOTE — Telephone Encounter (Signed)
Pre-Visit Call completed with patient and chart updated.   Pre-Visit Info documented in Specialty Comments under [employer].    

## 2014-09-03 ENCOUNTER — Encounter: Payer: Self-pay | Admitting: Family Medicine

## 2014-09-03 ENCOUNTER — Ambulatory Visit (INDEPENDENT_AMBULATORY_CARE_PROVIDER_SITE_OTHER): Payer: Medicare Other | Admitting: Family Medicine

## 2014-09-03 VITALS — BP 118/80 | HR 89 | Temp 97.6°F | Ht 67.0 in | Wt 156.5 lb

## 2014-09-03 DIAGNOSIS — R739 Hyperglycemia, unspecified: Secondary | ICD-10-CM

## 2014-09-03 DIAGNOSIS — E785 Hyperlipidemia, unspecified: Secondary | ICD-10-CM | POA: Diagnosis not present

## 2014-09-03 DIAGNOSIS — M109 Gout, unspecified: Secondary | ICD-10-CM

## 2014-09-03 DIAGNOSIS — Z0000 Encounter for general adult medical examination without abnormal findings: Secondary | ICD-10-CM | POA: Diagnosis not present

## 2014-09-03 DIAGNOSIS — I10 Essential (primary) hypertension: Secondary | ICD-10-CM

## 2014-09-03 DIAGNOSIS — I482 Chronic atrial fibrillation, unspecified: Secondary | ICD-10-CM

## 2014-09-03 LAB — COMPREHENSIVE METABOLIC PANEL
ALT: 14 U/L (ref 0–53)
AST: 19 U/L (ref 0–37)
Albumin: 4.1 g/dL (ref 3.5–5.2)
Alkaline Phosphatase: 74 U/L (ref 39–117)
BUN: 32 mg/dL — ABNORMAL HIGH (ref 6–23)
CALCIUM: 9.4 mg/dL (ref 8.4–10.5)
CHLORIDE: 101 meq/L (ref 96–112)
CO2: 31 meq/L (ref 19–32)
Creatinine, Ser: 1.27 mg/dL (ref 0.40–1.50)
GFR: 57.51 mL/min — ABNORMAL LOW (ref >60.00)
Glucose, Bld: 97 mg/dL (ref 70–99)
POTASSIUM: 4.5 meq/L (ref 3.5–5.1)
SODIUM: 140 meq/L (ref 135–145)
Total Bilirubin: 0.8 mg/dL (ref 0.2–1.2)
Total Protein: 6.9 g/dL (ref 6.0–8.3)

## 2014-09-03 LAB — LIPID PANEL
Cholesterol: 281 mg/dL — ABNORMAL HIGH (ref 0–200)
HDL: 40.8 mg/dL (ref >39.00)
LDL CALC: 207 mg/dL — AB (ref 0–99)
NonHDL: 240.2
TRIGLYCERIDES: 168 mg/dL — AB (ref 0.0–149.0)
Total CHOL/HDL Ratio: 7
VLDL: 33.6 mg/dL (ref 0.0–40.0)

## 2014-09-03 LAB — CBC
HCT: 51.3 % (ref 39.0–52.0)
HEMOGLOBIN: 16.7 g/dL (ref 13.0–17.0)
MCHC: 32.6 g/dL (ref 30.0–36.0)
MCV: 92.1 fl (ref 78.0–100.0)
Platelets: 151 10*3/uL (ref 150.0–400.0)
RBC: 5.56 Mil/uL (ref 4.22–5.81)
RDW: 17.3 % — ABNORMAL HIGH (ref 11.5–15.5)
WBC: 9.4 10*3/uL (ref 4.0–10.5)

## 2014-09-03 LAB — URIC ACID: URIC ACID, SERUM: 4.4 mg/dL (ref 4.0–7.8)

## 2014-09-03 LAB — TSH: TSH: 2.09 u[IU]/mL (ref 0.35–4.50)

## 2014-09-03 LAB — HEMOGLOBIN A1C: HEMOGLOBIN A1C: 6.3 % (ref 4.6–6.5)

## 2014-09-03 MED ORDER — METOPROLOL SUCCINATE ER 100 MG PO TB24: ORAL_TABLET | ORAL | Status: DC

## 2014-09-03 NOTE — Patient Instructions (Signed)
Preventive Care for Adults A healthy lifestyle and preventive care can promote health and wellness. Preventive health guidelines for men include the following key practices:  A routine yearly physical is a good way to check with your health care provider about your health and preventative screening. It is a chance to share any concerns and updates on your health and to receive a thorough exam.  Visit your dentist for a routine exam and preventative care every 6 months. Brush your teeth twice a day and floss once a day. Good oral hygiene prevents tooth decay and gum disease.  The frequency of eye exams is based on your age, health, family medical history, use of contact lenses, and other factors. Follow your health care provider's recommendations for frequency of eye exams.  Eat a healthy diet. Foods such as vegetables, fruits, whole grains, low-fat dairy products, and lean protein foods contain the nutrients you need without too many calories. Decrease your intake of foods high in solid fats, added sugars, and salt. Eat the right amount of calories for you.Get information about a proper diet from your health care provider, if necessary.  Regular physical exercise is one of the most important things you can do for your health. Most adults should get at least 150 minutes of moderate-intensity exercise (any activity that increases your heart rate and causes you to sweat) each week. In addition, most adults need muscle-strengthening exercises on 2 or more days a week.  Maintain a healthy weight. The body mass index (BMI) is a screening tool to identify possible weight problems. It provides an estimate of body fat based on height and weight. Your health care provider can find your BMI and can help you achieve or maintain a healthy weight.For adults 20 years and older:  A BMI below 18.5 is considered underweight.  A BMI of 18.5 to 24.9 is normal.  A BMI of 25 to 29.9 is considered overweight.  A BMI  of 30 and above is considered obese.  Maintain normal blood lipids and cholesterol levels by exercising and minimizing your intake of saturated fat. Eat a balanced diet with plenty of fruit and vegetables. Blood tests for lipids and cholesterol should begin at age 50 and be repeated every 5 years. If your lipid or cholesterol levels are high, you are over 50, or you are at high risk for heart disease, you may need your cholesterol levels checked more frequently.Ongoing high lipid and cholesterol levels should be treated with medicines if diet and exercise are not working.  If you smoke, find out from your health care provider how to quit. If you do not use tobacco, do not start.  Lung cancer screening is recommended for adults aged 73-80 years who are at high risk for developing lung cancer because of a history of smoking. A yearly low-dose CT scan of the lungs is recommended for people who have at least a 30-pack-year history of smoking and are a current smoker or have quit within the past 15 years. A pack year of smoking is smoking an average of 1 pack of cigarettes a day for 1 year (for example: 1 pack a day for 30 years or 2 packs a day for 15 years). Yearly screening should continue until the smoker has stopped smoking for at least 15 years. Yearly screening should be stopped for people who develop a health problem that would prevent them from having lung cancer treatment.  If you choose to drink alcohol, do not have more than  2 drinks per day. One drink is considered to be 12 ounces (355 mL) of beer, 5 ounces (148 mL) of wine, or 1.5 ounces (44 mL) of liquor.  Avoid use of street drugs. Do not share needles with anyone. Ask for help if you need support or instructions about stopping the use of drugs.  High blood pressure causes heart disease and increases the risk of stroke. Your blood pressure should be checked at least every 1-2 years. Ongoing high blood pressure should be treated with  medicines, if weight loss and exercise are not effective.  If you are 45-79 years old, ask your health care provider if you should take aspirin to prevent heart disease.  Diabetes screening involves taking a blood sample to check your fasting blood sugar level. This should be done once every 3 years, after age 45, if you are within normal weight and without risk factors for diabetes. Testing should be considered at a younger age or be carried out more frequently if you are overweight and have at least 1 risk factor for diabetes.  Colorectal cancer can be detected and often prevented. Most routine colorectal cancer screening begins at the age of 50 and continues through age 75. However, your health care provider may recommend screening at an earlier age if you have risk factors for colon cancer. On a yearly basis, your health care provider may provide home test kits to check for hidden blood in the stool. Use of a small camera at the end of a tube to directly examine the colon (sigmoidoscopy or colonoscopy) can detect the earliest forms of colorectal cancer. Talk to your health care provider about this at age 50, when routine screening begins. Direct exam of the colon should be repeated every 5-10 years through age 75, unless early forms of precancerous polyps or small growths are found.  People who are at an increased risk for hepatitis B should be screened for this virus. You are considered at high risk for hepatitis B if:  You were born in a country where hepatitis B occurs often. Talk with your health care provider about which countries are considered high risk.  Your parents were born in a high-risk country and you have not received a shot to protect against hepatitis B (hepatitis B vaccine).  You have HIV or AIDS.  You use needles to inject street drugs.  You live with, or have sex with, someone who has hepatitis B.  You are a man who has sex with other men (MSM).  You get hemodialysis  treatment.  You take certain medicines for conditions such as cancer, organ transplantation, and autoimmune conditions.  Hepatitis C blood testing is recommended for all people born from 1945 through 1965 and any individual with known risks for hepatitis C.  Practice safe sex. Use condoms and avoid high-risk sexual practices to reduce the spread of sexually transmitted infections (STIs). STIs include gonorrhea, chlamydia, syphilis, trichomonas, herpes, HPV, and human immunodeficiency virus (HIV). Herpes, HIV, and HPV are viral illnesses that have no cure. They can result in disability, cancer, and death.  If you are at risk of being infected with HIV, it is recommended that you take a prescription medicine daily to prevent HIV infection. This is called preexposure prophylaxis (PrEP). You are considered at risk if:  You are a man who has sex with other men (MSM) and have other risk factors.  You are a heterosexual man, are sexually active, and are at increased risk for HIV infection.    You take drugs by injection.  You are sexually active with a partner who has HIV.  Talk with your health care provider about whether you are at high risk of being infected with HIV. If you choose to begin PrEP, you should first be tested for HIV. You should then be tested every 3 months for as long as you are taking PrEP.  A one-time screening for abdominal aortic aneurysm (AAA) and surgical repair of large AAAs by ultrasound are recommended for men ages 32 to 67 years who are current or former smokers.  Healthy men should no longer receive prostate-specific antigen (PSA) blood tests as part of routine cancer screening. Talk with your health care provider about prostate cancer screening.  Testicular cancer screening is not recommended for adult males who have no symptoms. Screening includes self-exam, a health care provider exam, and other screening tests. Consult with your health care provider about any symptoms  you have or any concerns you have about testicular cancer.  Use sunscreen. Apply sunscreen liberally and repeatedly throughout the day. You should seek shade when your shadow is shorter than you. Protect yourself by wearing long sleeves, pants, a wide-brimmed hat, and sunglasses year round, whenever you are outdoors.  Once a month, do a whole-body skin exam, using a mirror to look at the skin on your back. Tell your health care provider about new moles, moles that have irregular borders, moles that are larger than a pencil eraser, or moles that have changed in shape or color.  Stay current with required vaccines (immunizations).  Influenza vaccine. All adults should be immunized every year.  Tetanus, diphtheria, and acellular pertussis (Td, Tdap) vaccine. An adult who has not previously received Tdap or who does not know his vaccine status should receive 1 dose of Tdap. This initial dose should be followed by tetanus and diphtheria toxoids (Td) booster doses every 10 years. Adults with an unknown or incomplete history of completing a 3-dose immunization series with Td-containing vaccines should begin or complete a primary immunization series including a Tdap dose. Adults should receive a Td booster every 10 years.  Varicella vaccine. An adult without evidence of immunity to varicella should receive 2 doses or a second dose if he has previously received 1 dose.  Human papillomavirus (HPV) vaccine. Males aged 68-21 years who have not received the vaccine previously should receive the 3-dose series. Males aged 22-26 years may be immunized. Immunization is recommended through the age of 6 years for any male who has sex with males and did not get any or all doses earlier. Immunization is recommended for any person with an immunocompromised condition through the age of 49 years if he did not get any or all doses earlier. During the 3-dose series, the second dose should be obtained 4-8 weeks after the first  dose. The third dose should be obtained 24 weeks after the first dose and 16 weeks after the second dose.  Zoster vaccine. One dose is recommended for adults aged 50 years or older unless certain conditions are present.  Measles, mumps, and rubella (MMR) vaccine. Adults born before [birth year] generally are considered immune to measles and mumps. Adults born in [birth year] or later should have 1 or more doses of MMR vaccine unless there is a contraindication to the vaccine or there is laboratory evidence of immunity to each of the three diseases. A routine second dose of MMR vaccine should be obtained at least 28 days after the first dose for students attending postsecondary  schools, health care workers, or international travelers. People who received inactivated measles vaccine or an unknown type of measles vaccine during 1963-1967 should receive 2 doses of MMR vaccine. People who received inactivated mumps vaccine or an unknown type of mumps vaccine before 1979 and are at high risk for mumps infection should consider immunization with 2 doses of MMR vaccine. Unvaccinated health care workers born before 1957 who lack laboratory evidence of measles, mumps, or rubella immunity or laboratory confirmation of disease should consider measles and mumps immunization with 2 doses of MMR vaccine or rubella immunization with 1 dose of MMR vaccine.  Pneumococcal 13-valent conjugate (PCV13) vaccine. When indicated, a person who is uncertain of his immunization history and has no record of immunization should receive the PCV13 vaccine. An adult aged 19 years or older who has certain medical conditions and has not been previously immunized should receive 1 dose of PCV13 vaccine. This PCV13 should be followed with a dose of pneumococcal polysaccharide (PPSV23) vaccine. The PPSV23 vaccine dose should be obtained at least 8 weeks after the dose of PCV13 vaccine. An adult aged 19 years or older who has certain medical conditions and  previously received 1 or more doses of PPSV23 vaccine should receive 1 dose of PCV13. The PCV13 vaccine dose should be obtained 1 or more years after the last PPSV23 vaccine dose.  Pneumococcal polysaccharide (PPSV23) vaccine. When PCV13 is also indicated, PCV13 should be obtained first. All adults aged 65 years and older should be immunized. An adult younger than age 65 years who has certain medical conditions should be immunized. Any person who resides in a nursing home or long-term care facility should be immunized. An adult smoker should be immunized. People with an immunocompromised condition and certain other conditions should receive both PCV13 and PPSV23 vaccines. People with human immunodeficiency virus (HIV) infection should be immunized as soon as possible after diagnosis. Immunization during chemotherapy or radiation therapy should be avoided. Routine use of PPSV23 vaccine is not recommended for American Indians, [place] Natives, or people younger than 65 years unless there are medical conditions that require PPSV23 vaccine. When indicated, people who have unknown immunization and have no record of immunization should receive PPSV23 vaccine. One-time revaccination 5 years after the first dose of PPSV23 is recommended for people aged 19-64 years who have chronic kidney failure, nephrotic syndrome, asplenia, or immunocompromised conditions. People who received 1-2 doses of PPSV23 before age 65 years should receive another dose of PPSV23 vaccine at age 65 years or later if at least 5 years have passed since the previous dose. Doses of PPSV23 are not needed for people immunized with PPSV23 at or after age 65 years.  Meningococcal vaccine. Adults with asplenia or persistent complement component deficiencies should receive 2 doses of quadrivalent meningococcal conjugate (MenACWY-D) vaccine. The doses should be obtained at least 2 months apart. Microbiologists working with certain meningococcal bacteria,  military recruits, people at risk during an outbreak, and people who travel to or live in countries with a high rate of meningitis should be immunized. A first-year college student up through age 21 years who is living in a residence hall should receive a dose if he did not receive a dose on or after his 16th birthday. Adults who have certain high-risk conditions should receive one or more doses of vaccine.  Hepatitis A vaccine. Adults who wish to be protected from this disease, have certain high-risk conditions, work with hepatitis A-infected animals, work in hepatitis A research labs, or   travel to or work in countries with a high rate of hepatitis A should be immunized. Adults who were previously unvaccinated and who anticipate close contact with an international adoptee during the first 60 days after arrival in the [place] from a country with a high rate of hepatitis A should be immunized.  Hepatitis B vaccine. Adults should be immunized if they wish to be protected from this disease, have certain high-risk conditions, may be exposed to blood or other infectious body fluids, are household contacts or sex partners of hepatitis B positive people, are clients or workers in certain care facilities, or travel to or work in countries with a high rate of hepatitis B.  Haemophilus influenzae type b (Hib) vaccine. A previously unvaccinated person with asplenia or sickle cell disease or having a scheduled splenectomy should receive 1 dose of Hib vaccine. Regardless of previous immunization, a recipient of a hematopoietic stem cell transplant should receive a 3-dose series 6-12 months after his successful transplant. Hib vaccine is not recommended for adults with HIV infection. Preventive Service / Frequency Ages 52 to 17  Blood pressure check.** / Every 1 to 2 years.  Lipid and cholesterol check.** / Every 5 years beginning at age 69.  Hepatitis C blood test.** / For any individual with known risks for  hepatitis C.  Skin self-exam. / Monthly.  Influenza vaccine. / Every year.  Tetanus, diphtheria, and acellular pertussis (Tdap, Td) vaccine.** / Consult your health care provider. 1 dose of Td every 10 years.  Varicella vaccine.** / Consult your health care provider.  HPV vaccine. / 3 doses over 6 months, if 72 or younger.  Measles, mumps, rubella (MMR) vaccine.** / You need at least 1 dose of MMR if you were born in 1957 or later. You may also need a second dose.  Pneumococcal 13-valent conjugate (PCV13) vaccine.** / Consult your health care provider.  Pneumococcal polysaccharide (PPSV23) vaccine.** / 1 to 2 doses if you smoke cigarettes or if you have certain conditions.  Meningococcal vaccine.** / 1 dose if you are age 35 to 60 years and a Market researcher living in a residence hall, or have one of several medical conditions. You may also need additional booster doses.  Hepatitis A vaccine.** / Consult your health care provider.  Hepatitis B vaccine.** / Consult your health care provider.  Haemophilus influenzae type b (Hib) vaccine.** / Consult your health care provider. Ages 35 to 8  Blood pressure check.** / Every 1 to 2 years.  Lipid and cholesterol check.** / Every 5 years beginning at age 57.  Lung cancer screening. / Every year if you are aged 44-80 years and have a 30-pack-year history of smoking and currently smoke or have quit within the past 15 years. Yearly screening is stopped once you have quit smoking for at least 15 years or develop a health problem that would prevent you from having lung cancer treatment.  Fecal occult blood test (FOBT) of stool. / Every year beginning at age 55 and continuing until age 73. You may not have to do this test if you get a colonoscopy every 10 years.  Flexible sigmoidoscopy** or colonoscopy.** / Every 5 years for a flexible sigmoidoscopy or every 10 years for a colonoscopy beginning at age 28 and continuing until age  1.  Hepatitis C blood test.** / For all people born from [birth year] through 1965 and any individual with known risks for hepatitis C.  Skin self-exam. / Monthly.  Influenza vaccine. / Every  year.  Tetanus, diphtheria, and acellular pertussis (Tdap/Td) vaccine.** / Consult your health care provider. 1 dose of Td every 10 years.  Varicella vaccine.** / Consult your health care provider.  Zoster vaccine.** / 1 dose for adults aged 53 years or older.  Measles, mumps, rubella (MMR) vaccine.** / You need at least 1 dose of MMR if you were born in 1957 or later. You may also need a second dose.  Pneumococcal 13-valent conjugate (PCV13) vaccine.** / Consult your health care provider.  Pneumococcal polysaccharide (PPSV23) vaccine.** / 1 to 2 doses if you smoke cigarettes or if you have certain conditions.  Meningococcal vaccine.** / Consult your health care provider.  Hepatitis A vaccine.** / Consult your health care provider.  Hepatitis B vaccine.** / Consult your health care provider.  Haemophilus influenzae type b (Hib) vaccine.** / Consult your health care provider. Ages 77 and over  Blood pressure check.** / Every 1 to 2 years.  Lipid and cholesterol check.**/ Every 5 years beginning at age 85.  Lung cancer screening. / Every year if you are aged 55-80 years and have a 30-pack-year history of smoking and currently smoke or have quit within the past 15 years. Yearly screening is stopped once you have quit smoking for at least 15 years or develop a health problem that would prevent you from having lung cancer treatment.  Fecal occult blood test (FOBT) of stool. / Every year beginning at age 33 and continuing until age 11. You may not have to do this test if you get a colonoscopy every 10 years.  Flexible sigmoidoscopy** or colonoscopy.** / Every 5 years for a flexible sigmoidoscopy or every 10 years for a colonoscopy beginning at age 28 and continuing until age 73.  Hepatitis C blood  test.** / For all people born from [birth year] through 1963 and any individual with known risks for hepatitis C.  Abdominal aortic aneurysm (AAA) screening.** / A one-time screening for ages 50 to 27 years who are current or former smokers.  Skin self-exam. / Monthly.  Influenza vaccine. / Every year.  Tetanus, diphtheria, and acellular pertussis (Tdap/Td) vaccine.** / 1 dose of Td every 10 years.  Varicella vaccine.** / Consult your health care provider.  Zoster vaccine.** / 1 dose for adults aged 34 years or older.  Pneumococcal 13-valent conjugate (PCV13) vaccine.** / Consult your health care provider.  Pneumococcal polysaccharide (PPSV23) vaccine.** / 1 dose for all adults aged 63 years and older.  Meningococcal vaccine.** / Consult your health care provider.  Hepatitis A vaccine.** / Consult your health care provider.  Hepatitis B vaccine.** / Consult your health care provider.  Haemophilus influenzae type b (Hib) vaccine.** / Consult your health care provider. **Family history and personal history of risk and conditions may change your health care provider's recommendations. Document Released: 08/10/2001 Document Revised: 06/19/2013 Document Reviewed: 11/09/2010 [facility] Patient Information 2015 [place], [place]. This information is not intended to replace advice given to you by your health care provider. Make sure you discuss any questions you have with your health care provider.

## 2014-09-03 NOTE — Progress Notes (Signed)
Jordan Bautista  [id number] Jan 05, 1929 09/03/2014      Progress Note-Follow Up  Subjective  Chief Complaint  Chief Complaint  Patient presents with  . Annual Exam    HPI  Patient is a 81 y.o. male in today for routine medical care. Patient is in today for wellness exam. Generally feeling well. No recent illness. Did have cataracts removed last year with good results. Falls with [facility] for Coumadin checks. Reports his INR has been stable. Had a recent eye exam and he reports they did not find any glaucoma or macular degeneration. He's having no further vision changes. Denies CP/palp/SOB/HA/congestion/fevers/GI or GU c/o. Taking meds as prescribed  Past Medical History  Diagnosis Date  . Atrial fibrillation   . High cholesterol   . Hypertension   . Skin cancer   . Heart failure   . Gout 08/20/2012  . Dehydration 02/06/2013  . HTN (hypertension) 05/13/2013    Past Surgical History  Procedure Laterality Date  . Basal cell carcinoma excision  2013  . Eye surgery      b/l cataracts in 2015    Family History  Problem Relation Age of Onset  . Prostate cancer Neg Hx   . Colon cancer Neg Hx   . Breast cancer Mother   . Hypertension Neg Hx   . Heart disease Neg Hx   . Diabetes Son     2 sons    History   Social History  . Marital Status: Married    Spouse Name: N/A  . Number of Children: N/A  . Years of Education: N/A   Occupational History  . retired     Psychologist, educational    Social History Main Topics  . Smoking status: Former Smoker -- 1.00 packs/day for 30 years    Types: Cigarettes, Pipe, Cigars    Quit date: 06/28/1978  . Smokeless tobacco: Never Used  . Alcohol Use: Yes  . Drug Use: No  . Sexual Activity: Not on file   Other Topics Concern  . Not on file   Social History Narrative    Current Outpatient Prescriptions on File Prior to Visit  Medication Sig Dispense Refill  . allopurinol (ZYLOPRIM) 300 MG tablet TAKE ONE (1) TABLET EACH DAY 30  tablet 2  . aspirin 81 MG tablet Take 81 mg by mouth daily.    . digoxin (DIGOX) 0.25 MG tablet Take 1 tablet (0.25 mg total) by mouth daily. 90 tablet 2  . Flaxseed, Linseed, (FLAXSEED OIL) 1000 MG CAPS 1 cap twice a day  0  . furosemide (LASIX) 40 MG tablet TAKE ONE (1) TABLET EACH DAY 30 tablet 5  . isosorbide dinitrate (ISORDIL) 30 MG tablet TAKE 1 TABLET once daily 120 tablet 4  . Olopatadine HCl 0.2 % SOLN Apply 1 drop to eye daily as needed.    . Pirfenidone 267 MG CAPS Take 3 tablets by mouth 3 (three) times daily. 3 tablets TID 270 capsule 11  . potassium chloride SA (K-DUR,KLOR-CON) 20 MEQ tablet TAKE ONE (1) TABLET EACH DAY 30 tablet 5  . triamcinolone (NASACORT AQ) 55 MCG/ACT AERO nasal inhaler USE 1 SPRAY IN EACH NOSTRIL DAILY as needed    . warfarin (COUMADIN) 5 MG tablet Taking 7.5 mg daily    . atorvastatin (LIPITOR) 40 MG tablet take 1 and 1/2 tablet daily -60 mg total. (Patient not taking: Reported on 09/03/2014) 45 tablet 6  . hydrOXYzine (ATARAX/VISTARIL) 25 MG tablet Take 1/2 to 1 T po qhs  for 3 weeks, then QHS PRN (Patient not taking: Reported on 09/02/2014) 30 tablet 3  . nystatin-triamcinolone ointment (MYCOLOG) Apply 1 application topically 2 (two) times daily. Use on hands (Patient not taking: Reported on 09/02/2014) 60 g 2   No current facility-administered medications on file prior to visit.    Allergies  Allergen Reactions  . Lipitor [Atorvastatin] Rash    Review of Systems  Review of Systems  Constitutional: Negative for fever, chills and malaise/fatigue.  HENT: Negative for congestion, hearing loss and nosebleeds.   Eyes: Negative for discharge.  Respiratory: Negative for cough, sputum production, shortness of breath and wheezing.   Cardiovascular: Negative for chest pain, palpitations and leg swelling.  Gastrointestinal: Negative for heartburn, nausea, vomiting, abdominal pain, diarrhea, constipation and blood in stool.  Genitourinary: Negative for dysuria,  urgency, frequency and hematuria.  Musculoskeletal: Negative for myalgias, back pain and falls.  Skin: Negative for rash.  Neurological: Negative for dizziness, tremors, sensory change, focal weakness, loss of consciousness, weakness and headaches.  Endo/Heme/Allergies: Negative for polydipsia. Does not bruise/bleed easily.  Psychiatric/Behavioral: Negative for depression and suicidal ideas. The patient is not nervous/anxious and does not have insomnia.     Objective  BP 118/80 mmHg  Pulse 89  Temp(Src) 97.6 F (36.4 C) (Oral)  Ht 5\' 7"  (1.702 m)  Wt 156 lb 8 oz (70.988 kg)  BMI 24.51 kg/m2  SpO2 91%  Physical Exam  Physical Exam  Constitutional: He is oriented to person, place, and time and well-developed, well-nourished, and in no distress. No distress.  HENT:  Head: Normocephalic and atraumatic.  Eyes: Conjunctivae are normal.  Neck: Neck supple. No thyromegaly present.  Cardiovascular: Normal rate, regular rhythm and normal heart sounds.   No murmur heard. Pulmonary/Chest: Effort normal and breath sounds normal. No respiratory distress.  Abdominal: He exhibits no distension and no mass. There is no tenderness.  Musculoskeletal: He exhibits no edema.  Neurological: He is alert and oriented to person, place, and time.  Skin: Skin is warm.  Psychiatric: Memory, affect and judgment normal.    Lab Results  Component Value Date   TSH 1.88 03/26/2014   Lab Results  Component Value Date   WBC 10.7* 04/30/2014   HGB 17.1* 04/30/2014   HCT 53.9* 04/30/2014   MCV 93.3 04/30/2014   PLT 162.0 04/30/2014   Lab Results  Component Value Date   CREATININE 1.3 03/26/2014   BUN 45* 03/26/2014   NA 138 03/26/2014   K 4.2 03/26/2014   CL 102 03/26/2014   CO2 26 03/26/2014   Lab Results  Component Value Date   ALT 20 03/26/2014   AST 20 03/26/2014   ALKPHOS 71 03/26/2014   BILITOT 1.1 03/26/2014   Lab Results  Component Value Date   CHOL 227* 03/26/2014   Lab  Results  Component Value Date   HDL 38.50* 03/26/2014   Lab Results  Component Value Date   LDLCALC 149* 03/26/2014   Lab Results  Component Value Date   TRIG 197.0* 03/26/2014   Lab Results  Component Value Date   CHOLHDL 6 03/26/2014     Assessment & Plan  HTN (hypertension) Well controlled, no changes to meds. Encouraged heart healthy diet such as the DASH diet and exercise as tolerated.    Hyperlipidemia Had not been taking his lipitor due to side effects.. Encouraged heart healthy diet, increase exercise, avoid trans fats, consider a krill oil cap daily. Try Crestor 5 mg twice weekly   Hyperglycemia hgba1c  acceptable, minimize simple carbs. Increase exercise as tolerated   Medicare annual wellness visit, subsequent Patient denies any difficulties at home. No trouble with ADLs, depression or falls. No recent changes to vision or hearing. Is UTD with immunizations. Is UTD with screening. Discussed Advanced Directives, patient agrees to bring [place] copies of documents if can. Encouraged heart healthy diet, exercise as tolerated and adequate sleep. Follows with [facility] for coumadin checks Sees Dr Tamala Julian of dermatology Sees Dr Sabino Niemann of opthamology Declines colonoscopy

## 2014-09-03 NOTE — Progress Notes (Signed)
Pre visit review using our clinic review tool, if applicable. No additional management support is needed unless otherwise documented below in the visit note. 

## 2014-09-04 ENCOUNTER — Other Ambulatory Visit: Payer: Self-pay | Admitting: Family Medicine

## 2014-09-04 NOTE — Telephone Encounter (Signed)
Last filled: 06/07/14 Amt: 30, 2 refills Last OV:  09/03/14  Med filled x 6 months.

## 2014-09-06 ENCOUNTER — Other Ambulatory Visit: Payer: Self-pay | Admitting: Family Medicine

## 2014-09-06 MED ORDER — ROSUVASTATIN CALCIUM 5 MG PO TABS: ORAL_TABLET | ORAL | Status: DC

## 2014-09-06 NOTE — Telephone Encounter (Signed)
Sent in crestor 5 mg, take 1 tablet twice a week, #10 with 3 refills to local pharmacy ([place]) per PCP instructions lab results dated 09/03/2014.

## 2014-09-11 ENCOUNTER — Encounter: Payer: Self-pay | Admitting: Family Medicine

## 2014-09-11 DIAGNOSIS — Z0000 Encounter for general adult medical examination without abnormal findings: Secondary | ICD-10-CM | POA: Insufficient documentation

## 2014-09-11 NOTE — Assessment & Plan Note (Signed)
hgba1c acceptable, minimize simple carbs. Increase exercise as tolerated.  

## 2014-09-11 NOTE — Assessment & Plan Note (Signed)
Patient denies any difficulties at home. No trouble with ADLs, depression or falls. No recent changes to vision or hearing. Is UTD with immunizations. Is UTD with screening. Discussed Advanced Directives, patient agrees to bring [place] copies of documents if can. Encouraged heart healthy diet, exercise as tolerated and adequate sleep. Follows with [facility] for coumadin checks Sees Dr Tamala Julian of dermatology Sees Dr Sabino Niemann of opthamology Declines colonoscopy

## 2014-09-11 NOTE — Assessment & Plan Note (Signed)
Well controlled, no changes to meds. Encouraged heart healthy diet such as the DASH diet and exercise as tolerated.  °

## 2014-09-11 NOTE — Assessment & Plan Note (Addendum)
Had not been taking his lipitor due to side effects.. Encouraged heart healthy diet, increase exercise, avoid trans fats, consider a krill oil cap daily. Try Crestor 5 mg twice weekly

## 2014-11-14 ENCOUNTER — Encounter: Payer: Self-pay | Admitting: Critical Care Medicine

## 2014-11-14 ENCOUNTER — Ambulatory Visit (INDEPENDENT_AMBULATORY_CARE_PROVIDER_SITE_OTHER): Payer: Medicare Other | Admitting: Critical Care Medicine

## 2014-11-14 VITALS — BP 114/77 | HR 71 | Temp 97.7°F | Ht 69.0 in | Wt 160.0 lb

## 2014-11-14 DIAGNOSIS — J0191 Acute recurrent sinusitis, unspecified: Secondary | ICD-10-CM | POA: Diagnosis not present

## 2014-11-14 DIAGNOSIS — J84112 Idiopathic pulmonary fibrosis: Secondary | ICD-10-CM | POA: Diagnosis not present

## 2014-11-14 MED ORDER — AMOXICILLIN-POT CLAVULANATE 875-125 MG PO TABS: 1.0000 | ORAL_TABLET | Freq: Two times a day (BID) | ORAL | Status: DC

## 2014-11-14 MED ORDER — FLUTICASONE PROPIONATE 50 MCG/ACT NA SUSP: 2.0000 | Freq: Every day | NASAL | Status: AC

## 2014-11-14 NOTE — Assessment & Plan Note (Signed)
ILD/IPF progressive despite esbriet Intercurrent acute sinusitis Plan Take augmentin one twice a day for 7days Use fluticasone two puff ea nostril daily Stay on esbriet and oxygen  Return 3 months

## 2014-11-14 NOTE — Patient Instructions (Signed)
Take augmentin one twice a day for 7days Use fluticasone two puff ea nostril daily Stay on esbriet and oxygen  Return 3 months

## 2014-11-14 NOTE — Progress Notes (Signed)
Subjective:    Patient ID: Jordan Bautista, male    DOB: 1928/12/26, 81 y.o.   MRN: [id number]  HPI  11/14/2014 Chief Complaint  Patient presents with  . Follow-up    breathing not as good, getting sob more often, a lot quicker.  very fatigued.      Notes more dyspnea, cough esp in am. Dyspnea is at rest. Mucus is yellow to clear, severe fatigue.  Not able to do ADLs as well. No qhs dyspnea  Notes pndrip and nasal congestion  Pt denies any significant sore throat, Pt denies any increase in rescue therapy over baseline, denies waking up needing it or having any early am or nocturnal exacerbations of coughing/wheezing/or dyspnea. Pt also denies any obvious fluctuation in symptoms with  weather or environmental change or other alleviating or aggravating factors  lfts recently ok , pt remains on esbriet Current Medications, Allergies, Complete Past Medical History, Past Surgical History, Family History, and Social History were reviewed in [employer] record.  Past Medical History  Diagnosis Date  . Atrial fibrillation   . High cholesterol   . Hypertension   . Skin cancer   . Heart failure   . Gout 08/20/2012  . Dehydration 02/06/2013  . HTN (hypertension) 05/13/2013     Family History  Problem Relation Age of Onset  . Prostate cancer Neg Hx   . Colon cancer Neg Hx   . Hypertension Neg Hx   . Breast cancer Mother   . Cancer Mother   . Stroke Mother   . Diabetes Son     type 2  . Cancer Son     stomach  . Heart disease Son     atrial fib, pacer in place  . Obesity Father     sudden death  . Arthritis Sister     s/p hip replacement  . Eczema Sister   . Cancer Brother     brain  . Diabetes Maternal Uncle   . Heart disease Maternal Uncle     s/p CABG  . Kidney disease Maternal Uncle   . Stroke Maternal Grandmother   . Diabetes Son     type 1     History   Social History  . Marital Status: Married    Spouse Name: N/A  . Number of  Children: N/A  . Years of Education: N/A   Occupational History  . retired     Psychologist, educational    Social History Main Topics  . Smoking status: Former Smoker -- 1.00 packs/day for 30 years    Types: Cigarettes, Pipe, Cigars    Quit date: 06/28/1978  . Smokeless tobacco: Never Used  . Alcohol Use: Yes     Comment: infrequent, special occasions  . Drug Use: No  . Sexual Activity: Not on file     Comment: lives with wife, no dietary restrictions   Other Topics Concern  . Not on file   Social History Narrative     Allergies  Allergen Reactions  . Lipitor [Atorvastatin] Rash     Outpatient Prescriptions Prior to Visit  Medication Sig Dispense Refill  . allopurinol (ZYLOPRIM) 300 MG tablet TAKE ONE (1) TABLET BY MOUTH EVERY DAY 30 tablet 5  . aspirin 81 MG tablet Take 81 mg by mouth daily.    . digoxin (DIGOX) 0.25 MG tablet Take 1 tablet (0.25 mg total) by mouth daily. 90 tablet 2  . Flaxseed, Linseed, (FLAXSEED OIL) 1000 MG CAPS 1 cap  twice a day  0  . furosemide (LASIX) 40 MG tablet TAKE ONE (1) TABLET EACH DAY 30 tablet 5  . isosorbide dinitrate (ISORDIL) 30 MG tablet TAKE 1 TABLET once daily 120 tablet 4  . metoprolol succinate (TOPROL-XL) 100 MG 24 hr tablet TAKE ONE (1) TABLET EACH DAY 30 tablet 6  . Pirfenidone 267 MG CAPS Take 3 tablets by mouth 3 (three) times daily. 3 tablets TID 270 capsule 11  . potassium chloride SA (K-DUR,KLOR-CON) 20 MEQ tablet TAKE ONE (1) TABLET EACH DAY 30 tablet 5  . rosuvastatin (CRESTOR) 5 MG tablet Take 1 tablet by mouth twice a week. 10 tablet 3  . warfarin (COUMADIN) 5 MG tablet Taking 7.5 mg daily    . atorvastatin (LIPITOR) 40 MG tablet take 1 and 1/2 tablet daily -60 mg total. (Patient not taking: Reported on 11/14/2014) 45 tablet 6  . nystatin-triamcinolone ointment (MYCOLOG) Apply 1 application topically 2 (two) times daily. Use on hands (Patient not taking: Reported on 11/14/2014) 60 g 2  . Olopatadine HCl 0.2 % SOLN Apply 1 drop to  eye daily as needed.    . triamcinolone (NASACORT AQ) 55 MCG/ACT AERO nasal inhaler USE 1 SPRAY IN EACH NOSTRIL DAILY as needed     No facility-administered medications prior to visit.   Review of Systems  Constitutional: Negative for fever, chills, diaphoresis, appetite change, fatigue and unexpected weight change.  HENT: Positive for facial swelling, postnasal drip and sinus pressure. Negative for congestion, ear discharge, ear pain, hearing loss, nosebleeds, rhinorrhea, sneezing, sore throat, trouble swallowing and voice change.   Eyes: Negative for discharge and itching.  Respiratory: Positive for cough and shortness of breath. Negative for apnea, choking, chest tightness, wheezing and stridor.   Cardiovascular: Negative for chest pain, palpitations and leg swelling.  Gastrointestinal: Negative for nausea, vomiting, abdominal pain and abdominal distention.  Endocrine: Negative.   Genitourinary: Negative.   Musculoskeletal: Negative for myalgias, joint swelling and arthralgias.  Skin: Negative for rash.  Allergic/Immunologic: Negative for environmental allergies.  Neurological: Negative for dizziness, syncope, weakness and headaches.  Hematological: Negative for adenopathy. Does not bruise/bleed easily.  Psychiatric/Behavioral: Negative for sleep disturbance and agitation. The patient is not nervous/anxious.        Objective:   Physical Exam  Constitutional: He is oriented to person, place, and time. He appears well-developed and well-nourished. He is active.  HENT:  Head: Normocephalic and atraumatic.  Nose: Mucosal edema and sinus tenderness present. No rhinorrhea, nasal deformity or septal deviation. No epistaxis. Right sinus exhibits frontal sinus tenderness. Right sinus exhibits no maxillary sinus tenderness. Left sinus exhibits frontal sinus tenderness. Left sinus exhibits no maxillary sinus tenderness.  Mouth/Throat: Oropharynx is clear and moist. No oropharyngeal exudate.    Eyes: Conjunctivae and EOM are normal. Pupils are equal, round, and reactive to light. No scleral icterus.  Neck: Trachea normal and normal range of motion. Neck supple. No JVD present. No tracheal tenderness and no muscular tenderness present. Carotid bruit is not present. No rigidity. No tracheal deviation, no edema, no erythema and normal range of motion present. No thyromegaly present.  Cardiovascular: Normal rate, regular rhythm, S1 normal, S2 normal, normal heart sounds, intact distal pulses and normal pulses.  PMI is not displaced.  Exam reveals no gallop, no S3, no S4, no distant heart sounds and no friction rub.   No murmur heard.  No systolic murmur is present   No diastolic murmur is present  Pulmonary/Chest: No accessory muscle usage or  stridor. No apnea and no tachypnea. No respiratory distress. He has decreased breath sounds. He has no wheezes. He has no rhonchi. He has rales in the right middle field, the right lower field, the left middle field and the left lower field. Chest wall is not dull to percussion. He exhibits no mass, no tenderness, no bony tenderness and no deformity.  Abdominal: Soft. Normal appearance and bowel sounds are normal. He exhibits no distension and no ascites. There is no hepatosplenomegaly. There is no tenderness. There is no rigidity, no rebound and no guarding.  Musculoskeletal: Normal range of motion.  Lymphadenopathy:       Head (right side): No submental and no submandibular adenopathy present.       Head (left side): No submental and no submandibular adenopathy present.    He has no cervical adenopathy.  Neurological: He is alert and oriented to person, place, and time. He has normal strength. No sensory deficit.  Skin: Skin is warm and dry. No rash noted. He is not diaphoretic. No pallor. Nails show no clubbing.  Psychiatric: He has a normal mood and affect. His speech is normal and behavior is normal.  Vitals reviewed.         Assessment &  Plan:  I personally reviewed all images and lab data in the [facility] system as well as any outside material available during this office visit and agree with the  radiology impressions.  I also have reviewed any data /notes/records if available in care everywhere.  IPF (idiopathic pulmonary fibrosis) ILD/IPF progressive despite esbriet Intercurrent acute sinusitis Plan Take augmentin one twice a day for 7days Use fluticasone two puff ea nostril daily Stay on esbriet and oxygen  Return 3 months   ACUTE Sinusitis Plan ABX and nasal steroid Rx  Connar was seen today for follow-up.  Diagnoses and all orders for this visit:  IPF (idiopathic pulmonary fibrosis)  Other orders -     amoxicillin-clavulanate (AUGMENTIN) 875-125 MG per tablet; Take 1 tablet by mouth 2 (two) times daily. -     fluticasone (FLONASE) 50 MCG/ACT nasal spray; Place 2 sprays into both nostrils daily.    I had an extended discussion with the patient reviewing all relevant studies completed to date and  lasting 15 to 20 minutes of a 25 minute visit on the following ongoing concerns:  Disease state, its progression, cont tx with esbriet, likely outcomes

## 2014-12-05 ENCOUNTER — Other Ambulatory Visit: Payer: Self-pay | Admitting: Family Medicine

## 2014-12-10 ENCOUNTER — Telehealth: Payer: Self-pay | Admitting: Family Medicine

## 2014-12-10 NOTE — Telephone Encounter (Signed)
Caller name: Maudie Mercury from [employer] Relationship to patient: Can be reached:[phone] option 1 reference "[id number]"   Reason for call: Needing prior auth for med

## 2014-12-10 NOTE — Telephone Encounter (Signed)
Addition to notes below [place] 250 MCG tablet [[id number]]

## 2014-12-11 NOTE — Telephone Encounter (Signed)
PA started and requested an expedited review. Awaiting determination. JG/CMA

## 2015-01-09 ENCOUNTER — Ambulatory Visit: Payer: Medicare Other | Admitting: Family Medicine

## 2015-02-06 ENCOUNTER — Other Ambulatory Visit: Payer: Self-pay | Admitting: Family Medicine

## 2015-02-10 ENCOUNTER — Encounter: Payer: Self-pay | Admitting: Family Medicine

## 2015-02-10 ENCOUNTER — Ambulatory Visit (INDEPENDENT_AMBULATORY_CARE_PROVIDER_SITE_OTHER): Payer: Medicare Other | Admitting: Family Medicine

## 2015-02-10 VITALS — BP 120/72 | Temp 97.5°F | Ht 66.0 in | Wt 157.1 lb

## 2015-02-10 DIAGNOSIS — I10 Essential (primary) hypertension: Secondary | ICD-10-CM

## 2015-02-10 DIAGNOSIS — M109 Gout, unspecified: Secondary | ICD-10-CM | POA: Diagnosis not present

## 2015-02-10 DIAGNOSIS — R739 Hyperglycemia, unspecified: Secondary | ICD-10-CM | POA: Diagnosis not present

## 2015-02-10 DIAGNOSIS — I482 Chronic atrial fibrillation, unspecified: Secondary | ICD-10-CM

## 2015-02-10 DIAGNOSIS — E785 Hyperlipidemia, unspecified: Secondary | ICD-10-CM | POA: Diagnosis not present

## 2015-02-10 DIAGNOSIS — G4733 Obstructive sleep apnea (adult) (pediatric): Secondary | ICD-10-CM

## 2015-02-10 MED ORDER — FUROSEMIDE 40 MG PO TABS: ORAL_TABLET | ORAL | Status: AC

## 2015-02-10 MED ORDER — POTASSIUM CHLORIDE CRYS ER 20 MEQ PO TBCR: EXTENDED_RELEASE_TABLET | ORAL | Status: AC

## 2015-02-10 MED ORDER — NEOMYCIN-POLYMYXIN-HC 3.5-10000-1 OT SOLN: 4.0000 [drp] | Freq: Three times a day (TID) | OTIC | Status: AC

## 2015-02-10 MED ORDER — METOPROLOL SUCCINATE ER 100 MG PO TB24: ORAL_TABLET | ORAL | Status: AC

## 2015-02-10 NOTE — Progress Notes (Signed)
Pre visit review using our clinic review tool, if applicable. No additional management support is needed unless otherwise documented below in the visit note. 

## 2015-02-10 NOTE — Patient Instructions (Signed)
Cerumen Impaction °A cerumen impaction is when the wax in your ear forms a plug. This plug usually causes reduced hearing. Sometimes it also causes an earache or dizziness. Removing a cerumen impaction can be difficult and painful. The wax sticks to the ear canal. The canal is sensitive and bleeds easily. If you try to remove a heavy wax buildup with a cotton tipped swab, you may push it in further. °Irrigation with water, suction, and small ear curettes may be used to clear out the wax. If the impaction is fixed to the skin in the ear canal, ear drops may be needed for a few days to loosen the wax. People who build up a lot of wax frequently can use ear wax removal products available in your local drugstore. °SEEK MEDICAL CARE IF:  °You develop an earache, increased hearing loss, or marked dizziness. °Document Released: 07/22/2004 Document Revised: 09/06/2011 Document Reviewed: 09/11/2009 °ExitCare® Patient Information ©2015 [employer]. This information is not intended to replace advice given to you by your health care provider. Make sure you discuss any questions you have with your health care provider. ° °

## 2015-02-11 ENCOUNTER — Other Ambulatory Visit: Payer: Self-pay | Admitting: Family Medicine

## 2015-02-11 LAB — COMPREHENSIVE METABOLIC PANEL
ALK PHOS: 75 U/L (ref 39–117)
ALT: 15 U/L (ref 0–53)
AST: 21 U/L (ref 0–37)
Albumin: 3.9 g/dL (ref 3.5–5.2)
BUN: 44 mg/dL — ABNORMAL HIGH (ref 6–23)
CO2: 31 mEq/L (ref 19–32)
Calcium: 9.1 mg/dL (ref 8.4–10.5)
Chloride: 103 mEq/L (ref 96–112)
Creatinine, Ser: 1.37 mg/dL (ref 0.40–1.50)
GFR: 52.64 mL/min — AB (ref >60.00)
GLUCOSE: 117 mg/dL — AB (ref 70–99)
POTASSIUM: 4.4 meq/L (ref 3.5–5.1)
Sodium: 141 mEq/L (ref 135–145)
TOTAL PROTEIN: 6.5 g/dL (ref 6.0–8.3)
Total Bilirubin: 0.7 mg/dL (ref 0.2–1.2)

## 2015-02-11 LAB — LIPID PANEL
CHOLESTEROL: 197 mg/dL (ref 0–200)
HDL: 32.5 mg/dL — AB (ref >39.00)
LDL Cholesterol: 130 mg/dL — ABNORMAL HIGH (ref 0–99)
NONHDL: 164.04
Total CHOL/HDL Ratio: 6
Triglycerides: 172 mg/dL — ABNORMAL HIGH (ref 0.0–149.0)
VLDL: 34.4 mg/dL (ref 0.0–40.0)

## 2015-02-11 LAB — CBC
HCT: 49.7 % (ref 39.0–52.0)
HEMOGLOBIN: 16 g/dL (ref 13.0–17.0)
MCHC: 32.2 g/dL (ref 30.0–36.0)
MCV: 93.5 fl (ref 78.0–100.0)
PLATELETS: 161 10*3/uL (ref 150.0–400.0)
RBC: 5.32 Mil/uL (ref 4.22–5.81)
RDW: 17.9 % — ABNORMAL HIGH (ref 11.5–15.5)
WBC: 8.6 10*3/uL (ref 4.0–10.5)

## 2015-02-11 LAB — DIGOXIN LEVEL: Digoxin Level: 0.8 ug/L (ref 0.8–2.0)

## 2015-02-11 LAB — URIC ACID: Uric Acid, Serum: 4.8 mg/dL (ref 4.0–7.8)

## 2015-02-11 LAB — HEMOGLOBIN A1C: HEMOGLOBIN A1C: 6.2 % (ref 4.6–6.5)

## 2015-02-11 LAB — TSH: TSH: 1.49 u[IU]/mL (ref 0.35–4.50)

## 2015-02-11 NOTE — Telephone Encounter (Signed)
Updated medication list per PCP instructions based on recent lab results

## 2015-02-12 ENCOUNTER — Telehealth: Payer: Self-pay

## 2015-02-12 ENCOUNTER — Telehealth: Payer: Self-pay | Admitting: Family Medicine

## 2015-02-12 MED ORDER — ROSUVASTATIN CALCIUM 5 MG PO TABS: ORAL_TABLET | ORAL | Status: AC

## 2015-02-12 NOTE — Telephone Encounter (Signed)
-----   Message from Mosie Lukes, MD sent at 02/11/2015  1:26 PM EDT ----- Cholesterol much better, would increase Crestor slightly to 5 mg po 3 x a week. Then recheck labs in 3 months

## 2015-02-12 NOTE — Telephone Encounter (Signed)
Pt notified and verbalized understanding. A refill was sent in, no questions at this time.

## 2015-02-12 NOTE — Telephone Encounter (Signed)
Informed pt of his medication and refilled his Crestor today for a 3 month supply.

## 2015-02-12 NOTE — Telephone Encounter (Signed)
Pt returned your call, Best # [phone]

## 2015-02-13 ENCOUNTER — Encounter: Payer: Self-pay | Admitting: Adult Health

## 2015-02-13 ENCOUNTER — Ambulatory Visit (INDEPENDENT_AMBULATORY_CARE_PROVIDER_SITE_OTHER): Payer: Medicare Other | Admitting: Adult Health

## 2015-02-13 VITALS — BP 130/87 | HR 73 | Ht 67.0 in | Wt 159.0 lb

## 2015-02-13 DIAGNOSIS — J84112 Idiopathic pulmonary fibrosis: Secondary | ICD-10-CM

## 2015-02-13 DIAGNOSIS — J309 Allergic rhinitis, unspecified: Secondary | ICD-10-CM | POA: Insufficient documentation

## 2015-02-13 DIAGNOSIS — J9611 Chronic respiratory failure with hypoxia: Secondary | ICD-10-CM

## 2015-02-13 DIAGNOSIS — J9610 Chronic respiratory failure, unspecified whether with hypoxia or hypercapnia: Secondary | ICD-10-CM | POA: Insufficient documentation

## 2015-02-13 NOTE — Assessment & Plan Note (Signed)
Compensated without flare   Plan  Continue on Esbriet .  Continue on Oxygen 3l/m .  May use Saline nasal spray Twice daily  As needed   May use Saline nasal gel At bedtime  As needed   May use Zyrtec 10mg  At bedtime  As needed  Drainage  follow up Dr. Elsworth Soho  In 3 months and As needed

## 2015-02-13 NOTE — Assessment & Plan Note (Signed)
Cont on O2 .  

## 2015-02-13 NOTE — Progress Notes (Signed)
   Subjective:    Patient ID: Jordan Bautista, male    DOB: Aug 23, 1928, 81 y.o.   MRN: [id number]  HPI 81 yo male with IPF with chronic resp failure on O2   02/13/2015 Follow up  Patient returns for three-month follow-up Patient has idiopathic pulmonary fibrosis. He is on Esbriet  3 times a day He remains on oxygen at 3 L. He says overall that his breathing is about the same He gets winded with minimal activity. He denies any flare of cough, wheezing or shortness of breath. Oxygen needs are the same He does complain of drainage. And stuffy nose Patient denies any chest pain, orthopnea, PND, or increased leg swelling   Review of Systems Constitutional:   No  weight loss, night sweats,  Fevers, chills, fatigue, or  lassitude.  HEENT:   No headaches,  Difficulty swallowing,  Tooth/dental problems, or  Sore throat,                No sneezing, itching, ear ache, nasal congestion, post nasal drip,   CV:  No chest pain,  Orthopnea, PND, swelling in lower extremities, anasarca, dizziness, palpitations, syncope.   GI  No heartburn, indigestion, abdominal pain, nausea, vomiting, diarrhea, change in bowel habits, loss of appetite, bloody stools.   Resp:   No chest wall deformity  Skin: no rash or lesions.  GU: no dysuria, change in color of urine, no urgency or frequency.  No flank pain, no hematuria   MS:  No joint pain or swelling.  No decreased range of motion.  No back pain.  Psych:  No change in mood or affect. No depression or anxiety.  No memory loss.         Objective:   Physical Exam GEN: A/Ox3; pleasant , NAD, elderly and chronically ill appearing  HEENT:  [place]/AT,  EACs-clear, TMs-wnl, NOSE-clear, THROAT-clear, no lesions, no postnasal drip or exudate noted.   NECK:  Supple w/ fair ROM; no JVD; normal carotid impulses w/o bruits; no thyromegaly or nodules palpated; no lymphadenopathy.  RESP  bibasilar crackles no accessory muscle use, no dullness to  percussion  CARD:  RRR, no m/r/g  , no peripheral edema, pulses intact, no cyanosis or clubbing.  GI:   Soft & nt; nml bowel sounds; no organomegaly or masses detected.  Musco: Warm bil, no deformities or joint swelling noted.   Neuro: alert, no focal deficits noted.    Skin: Warm, no lesions or rashes         Assessment & Plan:

## 2015-02-13 NOTE — Assessment & Plan Note (Signed)
Try zyrtec At bedtime  As needed   Try saline nasal rinses and gel As needed

## 2015-02-13 NOTE — Patient Instructions (Signed)
Continue on Esbriet .  Continue on Oxygen 3l/m .  May use Saline nasal spray Twice daily  As needed   May use Saline nasal gel At bedtime  As needed   May use Zyrtec 10mg  At bedtime  As needed  Drainage  follow up Dr. Elsworth Soho  In 3 months and As needed

## 2015-02-17 NOTE — Progress Notes (Signed)
Reviewed & agree with plan  

## 2015-02-23 ENCOUNTER — Encounter: Payer: Self-pay | Admitting: Family Medicine

## 2015-02-23 NOTE — Assessment & Plan Note (Signed)
hgba1c acceptable, minimize simple carbs. Increase exercise as tolerated.  

## 2015-02-23 NOTE — Assessment & Plan Note (Signed)
Uses CPAP nightly 

## 2015-02-23 NOTE — Assessment & Plan Note (Signed)
Did not tolerated Atorvastatin, will try Crestor 5 mg daily. Encouraged heart healthy diet, increase exercise, avoid trans fats, consider a krill oil cap daily

## 2015-02-23 NOTE — Assessment & Plan Note (Signed)
Rate controlled and tolerating Coumadin 

## 2015-02-23 NOTE — Progress Notes (Signed)
Subjective:    Patient ID: Jordan Bautista, male    DOB: 19-Jul-1928, 81 y.o.   MRN: [id number]  Chief Complaint  Patient presents with  . Follow-up    HPI Patient is in today for follow up. Doing fairly well. Greatest complaint is fatigue. Sleeps with CPAP nightly and sleeps for roughly 10 hours. Fatigues throughout the day anyway. No other acute complaint. No acute illness. Denies CP/palp/SOB/HA/congestion/fevers/GI or GU c/o. Taking meds as prescribed  Past Medical History  Diagnosis Date  . Atrial fibrillation   . High cholesterol   . Hypertension   . Skin cancer   . Heart failure   . Gout 08/20/2012  . Dehydration 02/06/2013  . HTN (hypertension) 05/13/2013    Past Surgical History  Procedure Laterality Date  . Basal cell carcinoma excision  2013  . Eye surgery      b/l cataracts in 2015    Family History  Problem Relation Age of Onset  . Prostate cancer Neg Hx   . Colon cancer Neg Hx   . Hypertension Neg Hx   . Breast cancer Mother   . Cancer Mother   . Stroke Mother   . Diabetes Son     type 2  . Cancer Son     stomach  . Heart disease Son     atrial fib, pacer in place  . Obesity Father     sudden death  . Arthritis Sister     s/p hip replacement  . Eczema Sister   . Cancer Brother     brain  . Diabetes Maternal Uncle   . Heart disease Maternal Uncle     s/p CABG  . Kidney disease Maternal Uncle   . Stroke Maternal Grandmother   . Diabetes Son     type 1    Social History   Social History  . Marital Status: Married    Spouse Name: N/A  . Number of Children: N/A  . Years of Education: N/A   Occupational History  . retired     Psychologist, educational    Social History Main Topics  . Smoking status: Former Smoker -- 1.00 packs/day for 30 years    Types: Cigarettes, Pipe, Cigars    Quit date: 06/28/1978  . Smokeless tobacco: Never Used  . Alcohol Use: Yes     Comment: infrequent, special occasions  . Drug Use: No  . Sexual Activity: Not on  file     Comment: lives with wife, no dietary restrictions   Other Topics Concern  . Not on file   Social History Narrative    Outpatient Prescriptions Prior to Visit  Medication Sig Dispense Refill  . allopurinol (ZYLOPRIM) 300 MG tablet TAKE ONE (1) TABLET EACH DAY 30 tablet 3  . aspirin 81 MG tablet Take 81 mg by mouth daily.    . digoxin (DIGOX) 0.25 MG tablet Take 1 tablet (0.25 mg total) by mouth daily. 90 tablet 2  . Flaxseed, Linseed, (FLAXSEED OIL) 1000 MG CAPS 1 cap twice a day  0  . fluticasone (FLONASE) 50 MCG/ACT nasal spray Place 2 sprays into both nostrils daily. 16 g 4  . isosorbide dinitrate (ISORDIL) 30 MG tablet TAKE 1 TABLET once daily 120 tablet 4  . Pirfenidone 267 MG CAPS Take 3 tablets by mouth 3 (three) times daily. 3 tablets TID 270 capsule 11  . warfarin (COUMADIN) 5 MG tablet Taking 7.5 mg daily    . furosemide (LASIX) 40 MG tablet TAKE  ONE (1) TABLET EACH DAY 30 tablet 5  . metoprolol succinate (TOPROL-XL) 100 MG 24 hr tablet TAKE ONE (1) TABLET EACH DAY 30 tablet 6  . potassium chloride SA (K-DUR,KLOR-CON) 20 MEQ tablet TAKE ONE (1) TABLET BY MOUTH EVERY DAY 30 tablet 3  . rosuvastatin (CRESTOR) 5 MG tablet Take 1 tablet by mouth twice a week. (Patient taking differently: Take 1 tablet by mouth three times a week.) 10 tablet 3  . amoxicillin-clavulanate (AUGMENTIN) 875-125 MG per tablet Take 1 tablet by mouth 2 (two) times daily. 14 tablet 0   No facility-administered medications prior to visit.    Allergies  Allergen Reactions  . Lipitor [Atorvastatin] Rash    Review of Systems  Constitutional: Positive for malaise/fatigue. Negative for fever.  HENT: Negative for congestion.   Eyes: Negative for discharge.  Respiratory: Negative for shortness of breath.   Cardiovascular: Negative for chest pain, palpitations and leg swelling.  Gastrointestinal: Negative for nausea and abdominal pain.  Genitourinary: Negative for dysuria.  Musculoskeletal:  Negative for falls.  Skin: Negative for rash.  Neurological: Negative for loss of consciousness and headaches.  Endo/Heme/Allergies: Negative for environmental allergies.  Psychiatric/Behavioral: Negative for depression. The patient is not nervous/anxious.        Objective:    Physical Exam  Constitutional: He is oriented to person, place, and time. He appears well-developed and well-nourished. No distress.  HENT:  Head: Normocephalic and atraumatic.  Nose: Nose normal.  Eyes: Right eye exhibits no discharge. Left eye exhibits no discharge.  Neck: Normal range of motion. Neck supple.  Cardiovascular: Normal rate.   Murmur heard. irregular  Pulmonary/Chest: Effort normal and breath sounds normal.  Abdominal: Soft. Bowel sounds are normal. There is no tenderness.  Musculoskeletal: He exhibits no edema.  Neurological: He is alert and oriented to person, place, and time.  Skin: Skin is warm and dry.  Psychiatric: He has a normal mood and affect.  Nursing note and vitals reviewed.   BP 120/72 mmHg  Temp(Src) 97.5 F (36.4 C) (Oral)  Ht 5\' 6"  (1.676 m)  Wt 157 lb 2 oz (71.271 kg)  BMI 25.37 kg/m2  SpO2 92% Wt Readings from Last 3 Encounters:  02/13/15 159 lb (72.122 kg)  02/10/15 157 lb 2 oz (71.271 kg)  11/14/14 160 lb (72.576 kg)     Lab Results  Component Value Date   WBC 8.6 02/10/2015   HGB 16.0 02/10/2015   HCT 49.7 02/10/2015   PLT 161.0 02/10/2015   GLUCOSE 117* 02/10/2015   CHOL 197 02/10/2015   TRIG 172.0* 02/10/2015   HDL 32.50* 02/10/2015   LDLCALC 130* 02/10/2015   ALT 15 02/10/2015   AST 21 02/10/2015   NA 141 02/10/2015   K 4.4 02/10/2015   CL 103 02/10/2015   CREATININE 1.37 02/10/2015   BUN 44* 02/10/2015   CO2 31 02/10/2015   TSH 1.49 02/10/2015   INR 2.95* 11/30/2011   HGBA1C 6.2 02/10/2015    Lab Results  Component Value Date   TSH 1.49 02/10/2015   Lab Results  Component Value Date   WBC 8.6 02/10/2015   HGB 16.0 02/10/2015    HCT 49.7 02/10/2015   MCV 93.5 02/10/2015   PLT 161.0 02/10/2015   Lab Results  Component Value Date   NA 141 02/10/2015   K 4.4 02/10/2015   CO2 31 02/10/2015   GLUCOSE 117* 02/10/2015   BUN 44* 02/10/2015   CREATININE 1.37 02/10/2015   BILITOT 0.7 02/10/2015  ALKPHOS 75 02/10/2015   AST 21 02/10/2015   ALT 15 02/10/2015   PROT 6.5 02/10/2015   ALBUMIN 3.9 02/10/2015   CALCIUM 9.1 02/10/2015   GFR 52.64* 02/10/2015   Lab Results  Component Value Date   CHOL 197 02/10/2015   Lab Results  Component Value Date   HDL 32.50* 02/10/2015   Lab Results  Component Value Date   LDLCALC 130* 02/10/2015   Lab Results  Component Value Date   TRIG 172.0* 02/10/2015   Lab Results  Component Value Date   CHOLHDL 6 02/10/2015   Lab Results  Component Value Date   HGBA1C 6.2 02/10/2015       Assessment & Plan:   Problem List Items Addressed This Visit    OSA (obstructive sleep apnea)    Uses CPAP nightly      Hyperlipidemia    Did not tolerated Atorvastatin, will try Crestor 5 mg daily. Encouraged heart healthy diet, increase exercise, avoid trans fats, consider a krill oil cap daily      Relevant Medications   metoprolol succinate (TOPROL-XL) 100 MG 24 hr tablet   furosemide (LASIX) 40 MG tablet   Other Relevant Orders   Hemoglobin A1c (Completed)   Comprehensive metabolic panel (Completed)   Lipid panel (Completed)   TSH (Completed)   CBC (Completed)   Digoxin level (Completed)   Uric acid (Completed)   Hyperglycemia    hgba1c acceptable, minimize simple carbs. Increase exercise as tolerated.       Relevant Orders   Hemoglobin A1c (Completed)   Comprehensive metabolic panel (Completed)   Lipid panel (Completed)   TSH (Completed)   CBC (Completed)   Digoxin level (Completed)   Uric acid (Completed)   HTN (hypertension)    Well controlled, no changes to meds. Encouraged heart healthy diet such as the DASH diet and exercise as tolerated.        Relevant Medications   metoprolol succinate (TOPROL-XL) 100 MG 24 hr tablet   furosemide (LASIX) 40 MG tablet   RESOLVED: Gout   Relevant Orders   Hemoglobin A1c (Completed)   Comprehensive metabolic panel (Completed)   Lipid panel (Completed)   TSH (Completed)   CBC (Completed)   Digoxin level (Completed)   Uric acid (Completed)   Atrial fibrillation - Primary    Rate controlled and tolerating Coumadin      Relevant Medications   metoprolol succinate (TOPROL-XL) 100 MG 24 hr tablet   furosemide (LASIX) 40 MG tablet   Other Relevant Orders   Hemoglobin A1c (Completed)   Comprehensive metabolic panel (Completed)   Lipid panel (Completed)   TSH (Completed)   CBC (Completed)   Digoxin level (Completed)   Uric acid (Completed)      I have discontinued Mr. Remer's amoxicillin-clavulanate. I am also having him start on neomycin-polymyxin-hydrocortisone. Additionally, I am having him maintain his aspirin, Flaxseed Oil, warfarin, digoxin, isosorbide dinitrate, Pirfenidone, fluticasone, allopurinol, potassium chloride SA, metoprolol succinate, and furosemide.  Meds ordered this encounter  Medications  . potassium chloride SA (K-DUR,KLOR-CON) 20 MEQ tablet    Sig: TAKE ONE (1) TABLET BY MOUTH EVERY DAY    Dispense:  30 tablet    Refill:  6  . metoprolol succinate (TOPROL-XL) 100 MG 24 hr tablet    Sig: TAKE ONE (1) TABLET EACH DAY    Dispense:  30 tablet    Refill:  6  . furosemide (LASIX) 40 MG tablet    Sig: TAKE ONE (1) TABLET EACH DAY  Dispense:  30 tablet    Refill:  6  . neomycin-polymyxin-hydrocortisone (CORTISPORIN) otic solution    Sig: Place 4 drops into the left ear 3 (three) times daily.    Dispense:  10 mL    Refill:  0     Penni Homans, MD

## 2015-02-23 NOTE — Assessment & Plan Note (Signed)
Well controlled, no changes to meds. Encouraged heart healthy diet such as the DASH diet and exercise as tolerated.  °

## 2015-03-26 ENCOUNTER — Other Ambulatory Visit: Payer: Self-pay | Admitting: Family Medicine

## 2015-04-09 ENCOUNTER — Telehealth: Payer: Self-pay | Admitting: Family Medicine

## 2015-04-09 NOTE — Telephone Encounter (Signed)
error:[id number] ° °

## 2015-04-29 DEATH — deceased

## 2015-07-31 ENCOUNTER — Telehealth: Payer: Self-pay | Admitting: Family Medicine

## 2015-07-31 NOTE — Telephone Encounter (Signed)
I actually do not know how to change status to deceased. How do we confirm this and update chart?

## 2015-07-31 NOTE — Telephone Encounter (Signed)
Per notes on acct pt is deceased. Please update chart.

## 2015-08-05 ENCOUNTER — Ambulatory Visit: Payer: Medicare Other | Admitting: Family Medicine
